# Patient Record
Sex: Male | Born: 1935 | Race: White | Hispanic: No | Marital: Married | Smoking: Former smoker
Health system: Southern US, Community
[De-identification: ages and names within clinical notes are randomized; demographics above are authoritative.]

## PROBLEM LIST (undated history)

## (undated) DIAGNOSIS — C61 Malignant neoplasm of prostate: Secondary | ICD-10-CM

## (undated) HISTORY — DX: Malignant neoplasm of prostate: C61

---

## 2015-02-16 DIAGNOSIS — C7951 Secondary malignant neoplasm of bone: Principal | ICD-10-CM

## 2015-02-16 DIAGNOSIS — C61 Malignant neoplasm of prostate: Secondary | ICD-10-CM | POA: Insufficient documentation

## 2017-10-08 ENCOUNTER — Other Ambulatory Visit (HOSPITAL_COMMUNITY): Payer: Self-pay | Admitting: *Deleted

## 2017-10-08 DIAGNOSIS — C7951 Secondary malignant neoplasm of bone: Principal | ICD-10-CM

## 2017-10-08 DIAGNOSIS — C61 Malignant neoplasm of prostate: Secondary | ICD-10-CM

## 2017-11-10 ENCOUNTER — Inpatient Hospital Stay (HOSPITAL_COMMUNITY): Payer: Medicare Other | Attending: Hematology

## 2017-11-10 ENCOUNTER — Encounter (HOSPITAL_COMMUNITY): Payer: Self-pay | Admitting: Hematology

## 2017-11-10 ENCOUNTER — Other Ambulatory Visit: Payer: Self-pay

## 2017-11-10 ENCOUNTER — Inpatient Hospital Stay (HOSPITAL_BASED_OUTPATIENT_CLINIC_OR_DEPARTMENT_OTHER): Payer: Medicare Other | Admitting: Hematology

## 2017-11-10 DIAGNOSIS — Z87891 Personal history of nicotine dependence: Secondary | ICD-10-CM | POA: Diagnosis not present

## 2017-11-10 DIAGNOSIS — Z191 Hormone sensitive malignancy status: Secondary | ICD-10-CM | POA: Diagnosis not present

## 2017-11-10 DIAGNOSIS — Z7982 Long term (current) use of aspirin: Secondary | ICD-10-CM | POA: Diagnosis not present

## 2017-11-10 DIAGNOSIS — Z79899 Other long term (current) drug therapy: Secondary | ICD-10-CM | POA: Insufficient documentation

## 2017-11-10 DIAGNOSIS — C61 Malignant neoplasm of prostate: Secondary | ICD-10-CM | POA: Insufficient documentation

## 2017-11-10 DIAGNOSIS — R232 Flushing: Secondary | ICD-10-CM | POA: Diagnosis not present

## 2017-11-10 DIAGNOSIS — C7951 Secondary malignant neoplasm of bone: Secondary | ICD-10-CM | POA: Insufficient documentation

## 2017-11-10 LAB — COMPREHENSIVE METABOLIC PANEL
ALBUMIN: 4.2 g/dL (ref 3.5–5.0)
ALT: 14 U/L — ABNORMAL LOW (ref 17–63)
ANION GAP: 14 (ref 5–15)
AST: 19 U/L (ref 15–41)
Alkaline Phosphatase: 124 U/L (ref 38–126)
BUN: 19 mg/dL (ref 6–20)
CHLORIDE: 103 mmol/L (ref 101–111)
CO2: 21 mmol/L — AB (ref 22–32)
Calcium: 9.5 mg/dL (ref 8.9–10.3)
Creatinine, Ser: 0.96 mg/dL (ref 0.61–1.24)
GFR calc Af Amer: >60 mL/min (ref >60)
GFR calc non Af Amer: >60 mL/min (ref >60)
Glucose, Bld: 94 mg/dL (ref 65–99)
POTASSIUM: 4 mmol/L (ref 3.5–5.1)
SODIUM: 138 mmol/L (ref 135–145)
Total Bilirubin: 1 mg/dL (ref 0.3–1.2)
Total Protein: 6.9 g/dL (ref 6.5–8.1)

## 2017-11-10 LAB — CBC WITH DIFFERENTIAL/PLATELET
BASOS PCT: 0 %
Basophils Absolute: 0 10*3/uL (ref 0.0–0.1)
EOS ABS: 0.3 10*3/uL (ref 0.0–0.7)
EOS PCT: 3 %
HCT: 37.3 % — ABNORMAL LOW (ref 39.0–52.0)
Hemoglobin: 12.4 g/dL — ABNORMAL LOW (ref 13.0–17.0)
LYMPHS ABS: 1 10*3/uL (ref 0.7–4.0)
Lymphocytes Relative: 11 %
MCH: 29.2 pg (ref 26.0–34.0)
MCHC: 33.2 g/dL (ref 30.0–36.0)
MCV: 88 fL (ref 78.0–100.0)
MONOS PCT: 8 %
Monocytes Absolute: 0.8 10*3/uL (ref 0.1–1.0)
Neutro Abs: 7.4 10*3/uL (ref 1.7–7.7)
Neutrophils Relative %: 78 %
PLATELETS: 186 10*3/uL (ref 150–400)
RBC: 4.24 MIL/uL (ref 4.22–5.81)
RDW: 15 % (ref 11.5–15.5)
WBC: 9.4 10*3/uL (ref 4.0–10.5)

## 2017-11-10 LAB — PSA: Prostatic Specific Antigen: 16.14 ng/mL — ABNORMAL HIGH (ref 0.00–4.00)

## 2017-11-10 NOTE — Progress Notes (Signed)
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  HISTORY OF PRESENTING ILLNESS:  Ryan Walls 84 y.o. male is seen today for follow-up of metastatic castration sensitive prostate cancer.  He has been taking Abiraterone and prednisone for over a year.  He is tolerating it very well.  He has occasional hot flashes.  He denies any new onset pains.  He recently had a knee replacement surgery and has done fairly well.  He denies any ankle swellings.  Denies any severe tiredness.  His last PSA is in the range of 16 done in [place] in February of this year.  He denies any hospitalizations other than for knee replacement surgery.  He denies any infections in the past 6 months.  No fevers, night sweats or weight loss reported.  MEDICAL HISTORY:  Past Medical History:  Diagnosis Date  . Prostate cancer [facility])     SURGICAL HISTORY: History reviewed. No pertinent surgical history.  SOCIAL HISTORY: Social History   Socioeconomic History  . Marital status: Married    Spouse name: Not on file  . Number of children: Not on file  . Years of education: Not on file  . Highest education level: Not on file  Occupational History  . Not on file  Social Needs  . Financial resource strain: Not on file  . Food insecurity:    Worry: Not on file    Inability: Not on file  . Transportation needs:    Medical: Not on file    Non-medical: Not on file  Tobacco Use  . Smoking status: Former Smoker    Types: Cigarettes    Last attempt to quit: 11/10/1977    Years since quitting: 40.0  Substance and Sexual Activity  . Alcohol use: Not on file  . Drug use: Not on file  . Sexual activity: Not on file  Lifestyle  . Physical activity:    Days per week: Not on file    Minutes per session: Not on file  . Stress: Not on file  Relationships  .  Social connections:    Talks on phone: Not on file    Gets together: Not on file    Attends religious service: Not on file    Active member of club or organization: Not on file    Attends meetings of clubs or organizations: Not on file    Relationship status: Not on file  . Intimate partner violence:    Fear of current or ex partner: Not on file    Emotionally abused: Not on file    Physically abused: Not on file    Forced sexual activity: Not on file  Other Topics Concern  . Not on file  Social History Narrative  . Not on file    FAMILY HISTORY: History reviewed. No pertinent family history.  ALLERGIES:  has no allergies on file.  MEDICATIONS:  Current Outpatient Medications  Medication Sig Dispense Refill  . aspirin 325 MG tablet Take by mouth.    . LUTEIN PO Take by mouth.    . predniSONE (DELTASONE) 5 MG tablet Take 10 mg by mouth daily.  99  . TRAVATAN Z 0.004 % SOLN ophthalmic solution     . Travoprost, BAK Free, (TRAVATAN Z) 0.004 % SOLN ophthalmic solution Apply to eye.    . Vitamin D, Ergocalciferol, (DRISDOL) 50000 units CAPS capsule Take  by mouth.    [name] ZYTIGA 500 MG tablet      No current facility-administered medications for this visit.     REVIEW OF SYSTEMS:   Constitutional: Denies fevers, chills or abnormal night sweats Eyes: Denies blurriness of vision, double vision or watery eyes Ears, nose, mouth, throat, and face: Denies mucositis or sore throat Respiratory: Denies cough, dyspnea or wheezes Cardiovascular: Denies palpitation, chest discomfort or lower extremity swelling Gastrointestinal:  Denies nausea, heartburn or change in bowel habits Skin: Denies abnormal skin rashes Lymphatics: Denies new lymphadenopathy or easy bruising Neurological:Denies numbness, tingling or new weaknesses Behavioral/Psych: Mood is stable, no new changes  All other systems were reviewed with the patient and are negative.  PHYSICAL EXAMINATION: ECOG PERFORMANCE STATUS: 1  - Symptomatic but completely ambulatory  Vitals:   11/10/17 1310  BP: (!) 123/99  Pulse: 91  Resp: 18  Temp: 98.6 F (37 C)  SpO2: 99%   There were no vitals filed for this visit.  GENERAL:alert, no distress and comfortable SKIN: skin color, texture, turgor are normal, no rashes or significant lesions EYES: normal, conjunctiva are pink and non-injected, sclera clear OROPHARYNX:no exudate, no erythema and lips, buccal mucosa, and tongue normal  NECK: supple, thyroid normal size, non-tender, without nodularity LYMPH:  no palpable lymphadenopathy in the cervical, axillary or inguinal LUNGS: clear to auscultation and percussion with normal breathing effort HEART: regular rate & rhythm and no murmurs and no lower extremity edema PSYCH: alert & oriented x 3 with fluent speech NEURO: no focal motor/sensory deficits  LABORATORY DATA:  I have reviewed the data as listed Lab Results  Component Value Date   WBC 9.4 11/10/2017   HGB 12.4 (L) 11/10/2017   HCT 37.3 (L) 11/10/2017   MCV 88.0 11/10/2017   PLT 186 11/10/2017     Chemistry      Component Value Date/Time   NA 138 11/10/2017 1218   K 4.0 11/10/2017 1218   CL 103 11/10/2017 1218   CO2 21 (L) 11/10/2017 1218   BUN 19 11/10/2017 1218   CREATININE 0.96 11/10/2017 1218      Component Value Date/Time   CALCIUM 9.5 11/10/2017 1218   ALKPHOS 124 11/10/2017 1218   AST 19 11/10/2017 1218   ALT 14 (L) 11/10/2017 1218   BILITOT 1.0 11/10/2017 1218       RADIOGRAPHIC STUDIES: I have reviewed results from bone scan from July 2016.  ASSESSMENT & PLAN:  Prostate cancer metastatic to bone ([place]) 1.  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 -He is tolerating it very well.  His liver function panel was within normal limits.  His PSA from today is pending.  Last PSA of around 16 done in [place] in February 2019, last bone scan in July 2016 showing activity in the midshaft of the left humerus, left calvarium, posterior right seventh rib, eighth and ninth ribs on the right side. -He receives Lupron every 6 months at Dr. Claris Che office.  2.  Bone metastasis: We talked about initiating him on denosumab.  He was reluctant to consider it.  We will revisit this issue at next visit.  Derek Jack, MD 11/10/2017 2:10 PM

## 2017-11-10 NOTE — Assessment & Plan Note (Addendum)
1.  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 -He is tolerating it very well.  His liver function panel was within normal limits.  His PSA from today is pending.  Last PSA of around 16 done in [place] in February 2019, last bone scan in July 2016 showing activity in the midshaft of the left humerus, left calvarium, posterior right seventh rib, eighth and ninth ribs on the right side. -He receives Lupron every 6 months at Dr. Claris Che office.  2.  Bone metastasis: We talked about initiating him on denosumab.  He was reluctant to consider it.  We will revisit this issue at next visit.

## 2017-11-13 ENCOUNTER — Ambulatory Visit (HOSPITAL_COMMUNITY): Payer: Self-pay | Admitting: Hematology

## 2017-11-13 ENCOUNTER — Other Ambulatory Visit (HOSPITAL_COMMUNITY): Payer: Self-pay

## 2017-11-16 ENCOUNTER — Other Ambulatory Visit (HOSPITAL_COMMUNITY): Payer: Self-pay | Admitting: Hematology

## 2017-11-30 ENCOUNTER — Other Ambulatory Visit (HOSPITAL_COMMUNITY): Payer: Self-pay

## 2017-11-30 MED ORDER — ZYTIGA 500 MG PO TABS: 1000.0000 mg | ORAL_TABLET | Freq: Every day | ORAL | 0 refills | Status: DC

## 2017-11-30 MED ORDER — ZYTIGA 500 MG PO TABS: 500.0000 mg | ORAL_TABLET | Freq: Every day | ORAL | 0 refills | Status: DC

## 2017-12-01 ENCOUNTER — Other Ambulatory Visit (HOSPITAL_COMMUNITY): Payer: Self-pay

## 2017-12-01 DIAGNOSIS — C61 Malignant neoplasm of prostate: Secondary | ICD-10-CM

## 2017-12-01 DIAGNOSIS — C7951 Secondary malignant neoplasm of bone: Principal | ICD-10-CM

## 2017-12-01 MED ORDER — ZYTIGA 500 MG PO TABS: 1000.0000 mg | ORAL_TABLET | Freq: Every day | ORAL | 0 refills | Status: DC

## 2018-01-17 ENCOUNTER — Other Ambulatory Visit (HOSPITAL_COMMUNITY): Payer: Self-pay | Admitting: Hematology

## 2018-01-17 DIAGNOSIS — C61 Malignant neoplasm of prostate: Secondary | ICD-10-CM

## 2018-01-17 DIAGNOSIS — C7951 Secondary malignant neoplasm of bone: Principal | ICD-10-CM

## 2018-01-21 ENCOUNTER — Telehealth (HOSPITAL_COMMUNITY): Payer: Self-pay | Admitting: *Deleted

## 2018-01-22 ENCOUNTER — Other Ambulatory Visit (HOSPITAL_COMMUNITY): Payer: Self-pay

## 2018-01-22 DIAGNOSIS — C61 Malignant neoplasm of prostate: Secondary | ICD-10-CM

## 2018-01-22 DIAGNOSIS — C7951 Secondary malignant neoplasm of bone: Principal | ICD-10-CM

## 2018-01-22 MED ORDER — ZYTIGA 500 MG PO TABS: 1000.0000 mg | ORAL_TABLET | Freq: Every day | ORAL | 0 refills | Status: DC

## 2018-01-22 NOTE — Telephone Encounter (Signed)
Received refill request from patient Zytiga. Reviewed with provider, chart checked and refilled.

## 2018-02-09 ENCOUNTER — Other Ambulatory Visit (HOSPITAL_COMMUNITY): Payer: Self-pay

## 2018-02-09 DIAGNOSIS — C61 Malignant neoplasm of prostate: Secondary | ICD-10-CM

## 2018-02-09 DIAGNOSIS — C7951 Secondary malignant neoplasm of bone: Principal | ICD-10-CM

## 2018-02-10 ENCOUNTER — Inpatient Hospital Stay (HOSPITAL_COMMUNITY): Payer: Medicare Other | Attending: Hematology

## 2018-02-10 ENCOUNTER — Inpatient Hospital Stay (HOSPITAL_COMMUNITY): Payer: Medicare Other | Attending: Hematology | Admitting: Hematology

## 2018-02-10 ENCOUNTER — Encounter (HOSPITAL_COMMUNITY): Payer: Self-pay | Admitting: Hematology

## 2018-02-10 ENCOUNTER — Other Ambulatory Visit: Payer: Self-pay

## 2018-02-10 VITALS — BP 140/76 | HR 91 | Temp 98.3°F | Resp 18 | Wt 246.4 lb

## 2018-02-10 DIAGNOSIS — C61 Malignant neoplasm of prostate: Secondary | ICD-10-CM | POA: Insufficient documentation

## 2018-02-10 DIAGNOSIS — Z7982 Long term (current) use of aspirin: Secondary | ICD-10-CM

## 2018-02-10 DIAGNOSIS — Z191 Hormone sensitive malignancy status: Secondary | ICD-10-CM | POA: Diagnosis not present

## 2018-02-10 DIAGNOSIS — Z87891 Personal history of nicotine dependence: Secondary | ICD-10-CM | POA: Diagnosis not present

## 2018-02-10 DIAGNOSIS — R232 Flushing: Secondary | ICD-10-CM | POA: Insufficient documentation

## 2018-02-10 DIAGNOSIS — Z79899 Other long term (current) drug therapy: Secondary | ICD-10-CM | POA: Diagnosis not present

## 2018-02-10 DIAGNOSIS — C7951 Secondary malignant neoplasm of bone: Secondary | ICD-10-CM | POA: Diagnosis not present

## 2018-02-10 LAB — COMPREHENSIVE METABOLIC PANEL
ALK PHOS: 108 U/L (ref 38–126)
ALT: 15 U/L (ref 0–44)
AST: 19 U/L (ref 15–41)
Albumin: 4 g/dL (ref 3.5–5.0)
Anion gap: 6 (ref 5–15)
BILIRUBIN TOTAL: 1.1 mg/dL (ref 0.3–1.2)
BUN: 17 mg/dL (ref 8–23)
CHLORIDE: 106 mmol/L (ref 98–111)
CO2: 27 mmol/L (ref 22–32)
Calcium: 8.9 mg/dL (ref 8.9–10.3)
Creatinine, Ser: 1.21 mg/dL (ref 0.61–1.24)
GFR calc Af Amer: >60 mL/min (ref >60)
GFR, EST NON AFRICAN AMERICAN: 54 mL/min — AB (ref >60)
Glucose, Bld: 105 mg/dL — ABNORMAL HIGH (ref 70–99)
Potassium: 4 mmol/L (ref 3.5–5.1)
Sodium: 139 mmol/L (ref 135–145)
TOTAL PROTEIN: 6.4 g/dL — AB (ref 6.5–8.1)

## 2018-02-10 LAB — CBC WITH DIFFERENTIAL/PLATELET
BASOS PCT: 0 %
Basophils Absolute: 0 10*3/uL (ref 0.0–0.1)
EOS ABS: 0.3 10*3/uL (ref 0.0–0.7)
EOS PCT: 4 %
HCT: 38.3 % — ABNORMAL LOW (ref 39.0–52.0)
HEMOGLOBIN: 12.8 g/dL — AB (ref 13.0–17.0)
LYMPHS ABS: 1.2 10*3/uL (ref 0.7–4.0)
Lymphocytes Relative: 14 %
MCH: 29.8 pg (ref 26.0–34.0)
MCHC: 33.4 g/dL (ref 30.0–36.0)
MCV: 89.3 fL (ref 78.0–100.0)
MONOS PCT: 10 %
Monocytes Absolute: 0.9 10*3/uL (ref 0.1–1.0)
Neutro Abs: 6.4 10*3/uL (ref 1.7–7.7)
Neutrophils Relative %: 72 %
PLATELETS: 183 10*3/uL (ref 150–400)
RBC: 4.29 MIL/uL (ref 4.22–5.81)
RDW: 15 % (ref 11.5–15.5)
WBC: 8.8 10*3/uL (ref 4.0–10.5)

## 2018-02-10 LAB — PSA: PROSTATIC SPECIFIC ANTIGEN: 12.04 ng/mL — AB (ref 0.00–4.00)

## 2018-02-10 NOTE — Assessment & Plan Note (Addendum)
1.  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 - He receives Lupron injection every 6 months at Dr. Claris Che office. - He is tolerating Zytiga thousand milligrams very well.  He takes prednisone 10 mg daily with it.  Last PSA 3 months ago was stable at 16.1.  PSA from today's pending.  We will do scans if there is any significant change in his PSA levels.  Last bone scan was done in July 2016 showing activity in the midshaft of the left humerus, left calvarium, posterior right seventh rib, eighth and ninth ribs on the right side.  2.  Bone metastasis: We talked about initiating him on denosumab.  He was reluctant to consider it.  We will revisit this at another time.

## 2018-02-10 NOTE — Progress Notes (Signed)
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  CURRENT THERAPY: Zytiga along with prednisone.   INTERVAL HISTORY:  Ryan Walls 84 y.o. male returns for follow-up of his prostate cancer to the bones.  He is tolerating Zytiga along with prednisone very well.  Minor hot flashes present.  Denies any new onset pains.  Denies any problems with urination.  No fevers or infections noted.  He is ambulating well after his knee replacement surgery.  No infections or hospitalizations were reported.    REVIEW OF SYSTEMS:  Review of Systems  Constitutional:       Minor hot flashes.  All other systems reviewed and are negative.    PAST MEDICAL/SURGICAL HISTORY:  Past Medical History:  Diagnosis Date  . Prostate cancer [facility])    History reviewed. No pertinent surgical history.   SOCIAL HISTORY:  Social History   Socioeconomic History  . Marital status: Married    Spouse name: Not on file  . Number of children: Not on file  . Years of education: Not on file  . Highest education level: Not on file  Occupational History  . Not on file  Social Needs  . Financial resource strain: Not on file  . Food insecurity:    Worry: Not on file    Inability: Not on file  . Transportation needs:    Medical: Not on file    Non-medical: Not on file  Tobacco Use  . Smoking status: Former Smoker    Types: Cigarettes    Last attempt to quit: 11/10/1977    Years since quitting: 40.2  Substance and Sexual Activity  . Alcohol use: Not on file  . Drug use: Not on file  . Sexual activity: Not on file  Lifestyle  . Physical activity:    Days per week: Not on file    Minutes per session: Not on file  . Stress: Not on file  Relationships  . Social  connections:    Talks on phone: Not on file    Gets together: Not on file    Attends religious service: Not on file    Active member of club or organization: Not on file    Attends meetings of clubs or organizations: Not on file    Relationship status: Not on file  . Intimate partner violence:    Fear of current or ex partner: Not on file    Emotionally abused: Not on file    Physically abused: Not on file    Forced sexual activity: Not on file  Other Topics Concern  . Not on file  Social History Narrative  . Not on file    FAMILY HISTORY:  History reviewed. No pertinent family history.  CURRENT MEDICATIONS:  Outpatient Encounter Medications as of 02/10/2018  Medication Sig  . aspirin 325 MG tablet Take by mouth.  . LUTEIN PO Take by mouth.  . predniSONE (DELTASONE) 5 MG tablet Take 10 mg by mouth daily.  . Vitamin D, Ergocalciferol, (DRISDOL) 50000 units CAPS capsule Take by mouth.  [name] ZYTIGA 500 MG tablet Take 2 tablets (1,000 mg total) by mouth daily.  . [DISCONTINUED] TRAVATAN Z 0.004 % SOLN ophthalmic solution   . [DISCONTINUED] Travoprost, BAK Free, (TRAVATAN Z) 0.004 % SOLN ophthalmic solution Apply  to eye.   No facility-administered encounter medications on file as of 02/10/2018.     ALLERGIES:  Not on File   PHYSICAL EXAM:  ECOG Performance status: 1  Vitals:   02/10/18 1510  BP: 140/76  Pulse: 91  Resp: 18  Temp: 98.3 F (36.8 C)  SpO2: 98%   Filed Weights   02/10/18 1510  Weight: 246 lb 6.4 oz (111.8 kg)    Physical Exam   LABORATORY DATA:  I have reviewed the labs as listed.  CBC    Component Value Date/Time   WBC 8.8 02/10/2018 1415   RBC 4.29 02/10/2018 1415   HGB 12.8 (L) 02/10/2018 1415   HCT 38.3 (L) 02/10/2018 1415   PLT 183 02/10/2018 1415   MCV 89.3 02/10/2018 1415   MCH 29.8 02/10/2018 1415   MCHC 33.4 02/10/2018 1415   RDW 15.0 02/10/2018 1415   LYMPHSABS 1.2 02/10/2018 1415   MONOABS 0.9 02/10/2018 1415   EOSABS 0.3  02/10/2018 1415   BASOSABS 0.0 02/10/2018 1415   CMP Latest Ref Rng & Units 02/10/2018 11/10/2017  Glucose 70 - 99 mg/dL 105(H) 94  BUN 8 - 23 mg/dL 17 19  Creatinine 0.61 - 1.24 mg/dL 1.21 0.96  Sodium 135 - 145 mmol/L 139 138  Potassium 3.5 - 5.1 mmol/L 4.0 4.0  Chloride 98 - 111 mmol/L 106 103  CO2 22 - 32 mmol/L 27 21(L)  Calcium 8.9 - 10.3 mg/dL 8.9 9.5  Total Protein 6.5 - 8.1 g/dL 6.4(L) 6.9  Total Bilirubin 0.3 - 1.2 mg/dL 1.1 1.0  Alkaline Phos 38 - 126 U/L 108 124  AST 15 - 41 U/L 19 19  ALT 0 - 44 U/L 15 14(L)      ASSESSMENT & PLAN:   Prostate cancer metastatic to bone ([facility]) 1.  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 - He receives Lupron injection every 6 months at Dr. Claris Che office. - He is tolerating Zytiga thousand milligrams very well.  He takes prednisone 10 mg daily with it.  Last PSA 3 months ago was stable at 16.1.  PSA from today's pending.  We will do scans if there is any significant change in his PSA levels.  Last bone scan was done in July 2016 showing activity in the midshaft of the left humerus, left calvarium, posterior right seventh rib, eighth and ninth ribs on the right side.  2.  Bone metastasis: We talked about initiating him on denosumab.  He was reluctant to consider it.  We will revisit this at another time.       Orders placed this encounter:  Orders Placed This Encounter  Procedures  . CBC with Differential/Platelet  . Comprehensive metabolic panel  . PSA      Derek Jack, MD [place] [phone]

## 2018-02-10 NOTE — Progress Notes (Deleted)
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     CANCER STAGING: Cancer Staging No matching staging information was found for the patient.   INTERVAL HISTORY:  Ryan Walls 84 y.o. 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   Overall, he tells me he has been feeling pretty well. Energy levels ***; appetite ***.      REVIEW OF SYSTEMS:  Review of Systems - Oncology   PAST MEDICAL/SURGICAL HISTORY:  Past Medical History:  Diagnosis Date  . Prostate cancer ([place])    No past surgical history on file.   SOCIAL HISTORY:  Social History   Socioeconomic History  . Marital status: Married    Spouse name: Not on file  . Number of children: Not on file  . Years of education: Not on file  . Highest education level: Not on file  Occupational History  . Not on file  Social Needs  . Financial resource strain: Not on file  . Food insecurity:    Worry: Not on file    Inability: Not on file  . Transportation needs:    Medical: Not on file    Non-medical: Not on file  Tobacco Use  . Smoking status: Former Smoker    Types: Cigarettes    Last attempt to quit: 11/10/1977    Years since quitting: 40.2  Substance and Sexual Activity  . Alcohol use: Not on file  . Drug use: Not on file  . Sexual activity: Not on file  Lifestyle  . Physical activity:    Days per week: Not on file    Minutes per session: Not on file  . Stress: Not on file  Relationships  . Social connections:    Talks on phone: Not on file    Gets together: Not on file    Attends religious service: Not on file    Active member of club or organization: Not on file    Attends  meetings of clubs or organizations: Not on file    Relationship status: Not on file  . Intimate partner violence:    Fear of current or ex partner: Not on file    Emotionally abused: Not on file    Physically abused: Not on file    Forced sexual activity: Not on file  Other Topics Concern  . Not on file  Social History Narrative  . Not on file    FAMILY HISTORY:  No family history on file.  CURRENT MEDICATIONS:  Outpatient Encounter Medications as of 02/10/2018  Medication Sig  . aspirin 325 MG tablet Take by mouth.  . LUTEIN PO Take by mouth.  . predniSONE (DELTASONE) 5 MG tablet Take 10 mg by mouth daily.  . TRAVATAN Z 0.004 % SOLN ophthalmic solution   . Travoprost, BAK Free, (TRAVATAN Z) 0.004 % SOLN ophthalmic solution Apply to eye.  . Vitamin D, Ergocalciferol, (DRISDOL) 50000 units CAPS capsule Take by mouth.  [name] ZYTIGA 500 MG tablet Take 2 tablets (1,000 mg total) by mouth daily.   No facility-administered encounter medications on file as of 02/10/2018.     ALLERGIES:  Not on File   PHYSICAL EXAM:  ECOG Performance status:  1  There were no vitals filed for this visit. There were no vitals filed for this visit.  Physical Exam   LABORATORY DATA:  I have reviewed the labs as listed.  CBC    Component Value Date/Time   WBC 8.8 02/10/2018 1415   RBC 4.29 02/10/2018 1415   HGB 12.8 (L) 02/10/2018 1415   HCT 38.3 (L) 02/10/2018 1415   PLT 183 02/10/2018 1415   MCV 89.3 02/10/2018 1415   MCH 29.8 02/10/2018 1415   MCHC 33.4 02/10/2018 1415   RDW 15.0 02/10/2018 1415   LYMPHSABS 1.2 02/10/2018 1415   MONOABS 0.9 02/10/2018 1415   EOSABS 0.3 02/10/2018 1415   BASOSABS 0.0 02/10/2018 1415   CMP Latest Ref Rng & Units 11/10/2017  Glucose 65 - 99 mg/dL 94  BUN 6 - 20 mg/dL 19  Creatinine 0.61 - 1.24 mg/dL 0.96  Sodium 135 - 145 mmol/L 138  Potassium 3.5 - 5.1 mmol/L 4.0  Chloride 101 - 111 mmol/L 103  CO2 22 - 32 mmol/L 21(L)  Calcium 8.9 - 10.3 mg/dL 9.5    Total Protein 6.5 - 8.1 g/dL 6.9  Total Bilirubin 0.3 - 1.2 mg/dL 1.0  Alkaline Phos 38 - 126 U/L 124  AST 15 - 41 U/L 19  ALT 17 - 63 U/L 14(L)       DIAGNOSTIC IMAGING:  *The following radiologic images and reports have been reviewed independently and agree with below findings.      ASSESSMENT & PLAN:   No problem-specific Assessment & Plan notes found for this encounter.      Orders placed this encounter:  No orders of the defined types were placed in this encounter.     Derek Jack, MD [place] [phone]

## 2018-02-22 ENCOUNTER — Other Ambulatory Visit (HOSPITAL_COMMUNITY): Payer: Self-pay | Admitting: *Deleted

## 2018-02-22 DIAGNOSIS — C7951 Secondary malignant neoplasm of bone: Principal | ICD-10-CM

## 2018-02-22 DIAGNOSIS — C61 Malignant neoplasm of prostate: Secondary | ICD-10-CM

## 2018-02-22 MED ORDER — ZYTIGA 500 MG PO TABS: 1000.0000 mg | ORAL_TABLET | Freq: Every day | ORAL | 0 refills | Status: DC

## 2018-02-22 NOTE — Telephone Encounter (Signed)
Chart reviewed, per Dr. Almeta Monas last office note, zytiga refilled.

## 2018-03-20 ENCOUNTER — Other Ambulatory Visit (HOSPITAL_COMMUNITY): Payer: Self-pay | Admitting: Hematology

## 2018-03-20 DIAGNOSIS — C61 Malignant neoplasm of prostate: Secondary | ICD-10-CM

## 2018-03-20 DIAGNOSIS — C7951 Secondary malignant neoplasm of bone: Principal | ICD-10-CM

## 2018-03-26 ENCOUNTER — Other Ambulatory Visit (HOSPITAL_COMMUNITY): Payer: Self-pay | Admitting: *Deleted

## 2018-03-26 DIAGNOSIS — C61 Malignant neoplasm of prostate: Secondary | ICD-10-CM

## 2018-03-26 DIAGNOSIS — C7951 Secondary malignant neoplasm of bone: Principal | ICD-10-CM

## 2018-03-26 MED ORDER — ZYTIGA 500 MG PO TABS: 1000.0000 mg | ORAL_TABLET | Freq: Every day | ORAL | 0 refills | Status: DC

## 2018-04-19 ENCOUNTER — Other Ambulatory Visit (HOSPITAL_COMMUNITY): Payer: Self-pay | Admitting: Hematology

## 2018-04-19 DIAGNOSIS — C7951 Secondary malignant neoplasm of bone: Principal | ICD-10-CM

## 2018-04-19 DIAGNOSIS — C61 Malignant neoplasm of prostate: Secondary | ICD-10-CM

## 2018-04-22 ENCOUNTER — Other Ambulatory Visit (HOSPITAL_COMMUNITY): Payer: Self-pay | Admitting: Nurse Practitioner

## 2018-05-03 ENCOUNTER — Other Ambulatory Visit (HOSPITAL_COMMUNITY): Payer: Self-pay | Admitting: *Deleted

## 2018-05-04 ENCOUNTER — Telehealth (HOSPITAL_COMMUNITY): Payer: Self-pay | Admitting: Hematology

## 2018-05-04 ENCOUNTER — Other Ambulatory Visit (HOSPITAL_COMMUNITY): Payer: Self-pay | Admitting: *Deleted

## 2018-05-04 DIAGNOSIS — C7951 Secondary malignant neoplasm of bone: Principal | ICD-10-CM

## 2018-05-04 DIAGNOSIS — C61 Malignant neoplasm of prostate: Secondary | ICD-10-CM

## 2018-05-04 MED ORDER — ZYTIGA 500 MG PO TABS: 1000.0000 mg | ORAL_TABLET | Freq: Every day | ORAL | 0 refills | Status: DC

## 2018-05-04 NOTE — Telephone Encounter (Signed)
Per Dr. Delton Coombes patient is to stay on 1000mg  daily of Zytiga.  Fabio Asa is refilled.

## 2018-05-04 NOTE — Telephone Encounter (Signed)
FAXED [place]

## 2018-05-05 ENCOUNTER — Other Ambulatory Visit (HOSPITAL_COMMUNITY): Payer: Self-pay | Admitting: Hematology

## 2018-05-05 DIAGNOSIS — C7951 Secondary malignant neoplasm of bone: Principal | ICD-10-CM

## 2018-05-05 DIAGNOSIS — C61 Malignant neoplasm of prostate: Secondary | ICD-10-CM

## 2018-05-17 ENCOUNTER — Other Ambulatory Visit: Payer: Self-pay

## 2018-05-17 ENCOUNTER — Inpatient Hospital Stay (HOSPITAL_COMMUNITY): Payer: Medicare Other | Attending: Hematology

## 2018-05-17 ENCOUNTER — Encounter (HOSPITAL_COMMUNITY): Payer: Self-pay | Admitting: Hematology

## 2018-05-17 ENCOUNTER — Inpatient Hospital Stay (HOSPITAL_BASED_OUTPATIENT_CLINIC_OR_DEPARTMENT_OTHER): Payer: Medicare Other | Admitting: Hematology

## 2018-05-17 VITALS — BP 123/58 | HR 78 | Temp 97.8°F | Resp 18 | Wt 249.0 lb

## 2018-05-17 DIAGNOSIS — C7951 Secondary malignant neoplasm of bone: Secondary | ICD-10-CM

## 2018-05-17 DIAGNOSIS — Z7982 Long term (current) use of aspirin: Secondary | ICD-10-CM | POA: Insufficient documentation

## 2018-05-17 DIAGNOSIS — Z87891 Personal history of nicotine dependence: Secondary | ICD-10-CM | POA: Insufficient documentation

## 2018-05-17 DIAGNOSIS — I4891 Unspecified atrial fibrillation: Secondary | ICD-10-CM | POA: Diagnosis not present

## 2018-05-17 DIAGNOSIS — Z7901 Long term (current) use of anticoagulants: Secondary | ICD-10-CM

## 2018-05-17 DIAGNOSIS — Z79899 Other long term (current) drug therapy: Secondary | ICD-10-CM | POA: Diagnosis not present

## 2018-05-17 DIAGNOSIS — Z191 Hormone sensitive malignancy status: Secondary | ICD-10-CM

## 2018-05-17 DIAGNOSIS — C61 Malignant neoplasm of prostate: Secondary | ICD-10-CM

## 2018-05-17 LAB — COMPREHENSIVE METABOLIC PANEL
ALBUMIN: 4.1 g/dL (ref 3.5–5.0)
ALT: 16 U/L (ref 0–44)
ANION GAP: 6 (ref 5–15)
AST: 18 U/L (ref 15–41)
Alkaline Phosphatase: 84 U/L (ref 38–126)
BILIRUBIN TOTAL: 1.1 mg/dL (ref 0.3–1.2)
BUN: 19 mg/dL (ref 8–23)
CHLORIDE: 109 mmol/L (ref 98–111)
CO2: 25 mmol/L (ref 22–32)
Calcium: 8.9 mg/dL (ref 8.9–10.3)
Creatinine, Ser: 1.26 mg/dL — ABNORMAL HIGH (ref 0.61–1.24)
GFR calc Af Amer: 60 mL/min — ABNORMAL LOW (ref >60)
GFR calc non Af Amer: 51 mL/min — ABNORMAL LOW (ref >60)
Glucose, Bld: 120 mg/dL — ABNORMAL HIGH (ref 70–99)
POTASSIUM: 3.9 mmol/L (ref 3.5–5.1)
SODIUM: 140 mmol/L (ref 135–145)
TOTAL PROTEIN: 6.3 g/dL — AB (ref 6.5–8.1)

## 2018-05-17 LAB — CBC WITH DIFFERENTIAL/PLATELET
Abs Immature Granulocytes: 0.04 10*3/uL (ref 0.00–0.07)
BASOS ABS: 0 10*3/uL (ref 0.0–0.1)
Basophils Relative: 1 %
EOS ABS: 0.3 10*3/uL (ref 0.0–0.5)
Eosinophils Relative: 4 %
HEMATOCRIT: 38 % — AB (ref 39.0–52.0)
Hemoglobin: 12.7 g/dL — ABNORMAL LOW (ref 13.0–17.0)
IMMATURE GRANULOCYTES: 1 %
LYMPHS ABS: 0.7 10*3/uL (ref 0.7–4.0)
LYMPHS PCT: 9 %
MCH: 30.6 pg (ref 26.0–34.0)
MCHC: 33.4 g/dL (ref 30.0–36.0)
MCV: 91.6 fL (ref 80.0–100.0)
Monocytes Absolute: 0.7 10*3/uL (ref 0.1–1.0)
Monocytes Relative: 8 %
NEUTROS PCT: 77 %
NRBC: 0 % (ref 0.0–0.2)
Neutro Abs: 6.4 10*3/uL (ref 1.7–7.7)
Platelets: 177 10*3/uL (ref 150–400)
RBC: 4.15 MIL/uL — AB (ref 4.22–5.81)
RDW: 14.8 % (ref 11.5–15.5)
WBC: 8.2 10*3/uL (ref 4.0–10.5)

## 2018-05-17 LAB — PSA: Prostatic Specific Antigen: 12.89 ng/mL — ABNORMAL HIGH (ref 0.00–4.00)

## 2018-05-17 NOTE — Patient Instructions (Signed)
[facility] Discharge Instructions  Follow up in 3 months with labs    Thank you for choosing [facility] to provide your oncology and hematology care.  To afford each patient quality time with our provider, please arrive at least 15 minutes before your scheduled appointment time.   If you have a lab appointment with the Cancer Center please come in thru the  Main Entrance and check in at the main information desk  You need to re-schedule your appointment should you arrive 10 or more minutes late.  We strive to give you quality time with our providers, and arriving late affects you and other patients whose appointments are after yours.  Also, if you no show three or more times for appointments you may be dismissed from the clinic at the providers discretion.     Again, thank you for choosing [facility].  Our hope is that these requests will decrease the amount of time that you wait before being seen by our physicians.       _____________________________________________________________  Should you have questions after your visit to [facility], please contact our office at [phone] between the hours of 8:00 a.m. and 4:30 p.m.  Voicemails left after 4:00 p.m. will not be returned until the following business day.  For prescription refill requests, have your pharmacy contact our office and allow 72 hours.    Cancer Center Support Programs:   > Cancer Support Group  2nd Tuesday of the month 1pm-2pm, Journey Room    

## 2018-05-17 NOTE — Assessment & Plan Note (Signed)
1.  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 - He is tolerating Zytiga very well along with prednisone.  Last scans done in July 2016 shows activity in the midshaft of the left humerus, left calvarium, posterior right seventh rib, eighth and ninth ribs on the right side. - His last PSA on 02/10/2018 was 12.04.  He reportedly had last Lupron injection 6 weeks ago at Dr. Claris Che office. - He was reportedly hospitalized for couple of days at [facility] for atrial fibrillation.  He is currently on amiodarone 200 mg daily along with Eliquis 5 mg twice daily. -We will follow-up on his PSA from today.  We will see him back in 3 months for follow-up.  2.  Bone metastasis: We talked about initiating him on denosumab.  He was reluctant to consider it.  We will revisit this at another time.

## 2018-05-17 NOTE — Progress Notes (Signed)
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  CURRENT THERAPY: Zytiga and prednisone   INTERVAL HISTORY:  Ryan Walls 84 y.o. 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 Patient is here with his wife today and doing well. He had one episode of A-fib where he was hospitalized. Otherwise he has no other complaints or side effects. His appetite and energy level are 75% and he has no problem maintaining his weight. He lives at home where he does all his own ADLs and activities. He denies any skin rashes or mouth sores. Denies any new pains. Denies any problems urinating or hematuria.     REVIEW OF SYSTEMS:  Review of Systems  All other systems reviewed and are negative.    PAST MEDICAL/SURGICAL HISTORY:  Past Medical History:  Diagnosis Date  . Prostate cancer [facility])    History reviewed. No pertinent surgical history.   SOCIAL HISTORY:  Social History   Socioeconomic History  . Marital status: Married    Spouse name: Not on file  . Number of children: Not on file  . Years of education: Not on file  . Highest education level: Not on file  Occupational History  . Not on file  Social Needs  . Financial resource strain: Not on file  . Food insecurity:    Worry: Not on file    Inability: Not on file  . Transportation needs:    Medical: Not on file    Non-medical: Not on file  Tobacco Use  . Smoking status: Former Smoker    Types: Cigarettes    Last attempt to quit: 11/10/1977    Years since quitting: 40.5  Substance and Sexual Activity  . Alcohol use: Not on file  . Drug use: Not on file  . Sexual activity: Not on file  Lifestyle  . Physical activity:    Days per week:  Not on file    Minutes per session: Not on file  . Stress: Not on file  Relationships  . Social connections:    Talks on phone: Not on file    Gets together: Not on file    Attends religious service: Not on file    Active member of club or organization: Not on file    Attends meetings of clubs or organizations: Not on file    Relationship status: Not on file  . Intimate partner violence:    Fear of current or ex partner: Not on file    Emotionally abused: Not on file    Physically abused: Not on file    Forced sexual activity: Not on file  Other Topics Concern  . Not on file  Social History Narrative  . Not on file    FAMILY HISTORY:  History reviewed. No pertinent family history.  CURRENT MEDICATIONS:  Outpatient Encounter Medications as of 05/17/2018  Medication Sig  . amiodarone (PACERONE) 200 MG tablet   . aspirin 325 MG tablet Take by mouth.  Arne Cleveland 5 MG TABS tablet Take 5 mg by mouth 2 (two) times daily.  . metoprolol succinate (TOPROL-XL) 25 MG 24 hr tablet Take 25 mg by mouth every morning.  . Multiple Vitamins-Minerals (MULTIVITAL PLATINUM) TABS Take by mouth.  . predniSONE (DELTASONE) 5 MG  tablet Take 10 mg by mouth daily.  . TRAVATAN Z 0.004 % SOLN ophthalmic solution   . Vitamin D, Ergocalciferol, (DRISDOL) 50000 units CAPS capsule Take by mouth.  [name] ZYTIGA 500 MG tablet Take 2 tablets (1,000 mg total) by mouth daily.  . [DISCONTINUED] LUTEIN PO Take by mouth.   No facility-administered encounter medications on file as of 05/17/2018.     ALLERGIES:  Not on File   PHYSICAL EXAM:  ECOG Performance status: 1  Vitals:   05/17/18 1432  BP: (!) 123/58  Pulse: 78  Resp: 18  Temp: 97.8 F (36.6 C)  SpO2: 99%   Filed Weights   05/17/18 1432  Weight: 249 lb (112.9 kg)    Physical Exam  Constitutional: He is oriented to person, place, and time. He appears well-developed and well-nourished.  Musculoskeletal: Normal range of motion.  Neurological: He  is alert and oriented to person, place, and time.  Skin: Skin is warm and dry.  Psychiatric: He has a normal mood and affect. His behavior is normal. Judgment and thought content normal.     LABORATORY DATA:  I have reviewed the labs as listed.  CBC    Component Value Date/Time   WBC 8.2 05/17/2018 1325   RBC 4.15 (L) 05/17/2018 1325   HGB 12.7 (L) 05/17/2018 1325   HCT 38.0 (L) 05/17/2018 1325   PLT 177 05/17/2018 1325   MCV 91.6 05/17/2018 1325   MCH 30.6 05/17/2018 1325   MCHC 33.4 05/17/2018 1325   RDW 14.8 05/17/2018 1325   LYMPHSABS 0.7 05/17/2018 1325   MONOABS 0.7 05/17/2018 1325   EOSABS 0.3 05/17/2018 1325   BASOSABS 0.0 05/17/2018 1325   CMP Latest Ref Rng & Units 05/17/2018 02/10/2018 11/10/2017  Glucose 70 - 99 mg/dL 120(H) 105(H) 94  BUN 8 - 23 mg/dL 19 17 19   Creatinine 0.61 - 1.24 mg/dL 1.26(H) 1.21 0.96  Sodium 135 - 145 mmol/L 140 139 138  Potassium 3.5 - 5.1 mmol/L 3.9 4.0 4.0  Chloride 98 - 111 mmol/L 109 106 103  CO2 22 - 32 mmol/L 25 27 21(L)  Calcium 8.9 - 10.3 mg/dL 8.9 8.9 9.5  Total Protein 6.5 - 8.1 g/dL 6.3(L) 6.4(L) 6.9  Total Bilirubin 0.3 - 1.2 mg/dL 1.1 1.1 1.0  Alkaline Phos 38 - 126 U/L 84 108 124  AST 15 - 41 U/L 18 19 19   ALT 0 - 44 U/L 16 15 14(L)        ASSESSMENT & PLAN:   Prostate cancer metastatic to bone (HCC) 1.  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 - He is tolerating Zytiga very well along with prednisone.  Last scans done in July 2016 shows activity in the midshaft of the left humerus, left calvarium, posterior right seventh rib, eighth and ninth ribs on the right side. - His  last PSA on 02/10/2018 was 12.04.  He reportedly had last Lupron injection 6 weeks ago at Dr. Claris Che office. - He was reportedly hospitalized for couple of days at [facility] for atrial fibrillation.  He is currently on amiodarone 200 mg daily along with Eliquis 5 mg twice daily. -We will follow-up  on his PSA from today.  We will see him back in 3 months for follow-up.  2.  Bone metastasis: We talked about initiating him on denosumab.  He was reluctant to consider it.  We will revisit this at another time.       Orders placed this encounter:  Orders Placed This Encounter  Procedures  . CBC with Differential/Platelet  . Comprehensive metabolic panel  . PSA      Derek Jack, MD [place] [phone]

## 2018-05-25 ENCOUNTER — Other Ambulatory Visit (HOSPITAL_COMMUNITY): Payer: Self-pay | Admitting: Hematology

## 2018-05-25 DIAGNOSIS — C61 Malignant neoplasm of prostate: Secondary | ICD-10-CM

## 2018-05-25 DIAGNOSIS — C7951 Secondary malignant neoplasm of bone: Principal | ICD-10-CM

## 2018-06-02 ENCOUNTER — Other Ambulatory Visit (HOSPITAL_COMMUNITY): Payer: Self-pay | Admitting: Hematology

## 2018-06-02 ENCOUNTER — Other Ambulatory Visit (HOSPITAL_COMMUNITY): Payer: Self-pay | Admitting: *Deleted

## 2018-06-02 DIAGNOSIS — C7951 Secondary malignant neoplasm of bone: Principal | ICD-10-CM

## 2018-06-02 DIAGNOSIS — C61 Malignant neoplasm of prostate: Secondary | ICD-10-CM

## 2018-06-02 MED ORDER — ZYTIGA 500 MG PO TABS: 1000.0000 mg | ORAL_TABLET | Freq: Every day | ORAL | 6 refills | Status: DC

## 2018-06-02 NOTE — Telephone Encounter (Signed)
FAXED [facility]IGA SCRIPT TO [facility] WITH 6 REFILLS

## 2018-06-02 NOTE — Telephone Encounter (Signed)
Chart reviewed, Zytiga refilled. 

## 2018-06-25 ENCOUNTER — Telehealth (HOSPITAL_COMMUNITY): Payer: Self-pay | Admitting: Hematology

## 2018-06-25 NOTE — Telephone Encounter (Signed)
PC TO [employer] @[phone] AND [phone] SPK WITH LEIGH, JENNIFER AND ANOTHER CSR. ALL ARE CONFUSED ON HOW TO OBTAIN AN EXCEPTION FOR THE PTS ZYTIGA. I WAS TOLD BY LEIGH CALL REF# [id number] THAT PTS RX WILL NOT EXPIRE TIL April 2020. ADVISED BY JENNIFER THAT A PA WAS NEEDED AND BLIND TRANSFERRED TO PA DEPT AND NO NAME CSR SUBMITTED A PA. NO RESOLUTION TO THIS ISSUE AS OF NOW. WILL CONTACT PT

## 2018-08-24 ENCOUNTER — Other Ambulatory Visit (HOSPITAL_COMMUNITY): Payer: PRIVATE HEALTH INSURANCE

## 2018-08-24 ENCOUNTER — Ambulatory Visit (HOSPITAL_COMMUNITY): Payer: PRIVATE HEALTH INSURANCE | Admitting: Hematology

## 2018-08-25 ENCOUNTER — Inpatient Hospital Stay (HOSPITAL_COMMUNITY): Payer: Medicare Other | Attending: Hematology | Admitting: Hematology

## 2018-08-25 ENCOUNTER — Other Ambulatory Visit (HOSPITAL_COMMUNITY): Payer: Self-pay | Admitting: *Deleted

## 2018-08-25 ENCOUNTER — Inpatient Hospital Stay (HOSPITAL_COMMUNITY): Payer: Medicare Other | Attending: Nurse Practitioner

## 2018-08-25 ENCOUNTER — Encounter (HOSPITAL_COMMUNITY): Payer: Self-pay | Admitting: Hematology

## 2018-08-25 VITALS — HR 52 | Temp 97.9°F | Resp 16 | Wt 249.0 lb

## 2018-08-25 DIAGNOSIS — Z7982 Long term (current) use of aspirin: Secondary | ICD-10-CM | POA: Insufficient documentation

## 2018-08-25 DIAGNOSIS — C61 Malignant neoplasm of prostate: Secondary | ICD-10-CM | POA: Insufficient documentation

## 2018-08-25 DIAGNOSIS — I4891 Unspecified atrial fibrillation: Secondary | ICD-10-CM | POA: Insufficient documentation

## 2018-08-25 DIAGNOSIS — Z7901 Long term (current) use of anticoagulants: Secondary | ICD-10-CM

## 2018-08-25 DIAGNOSIS — Z87891 Personal history of nicotine dependence: Secondary | ICD-10-CM | POA: Insufficient documentation

## 2018-08-25 DIAGNOSIS — Z79899 Other long term (current) drug therapy: Secondary | ICD-10-CM | POA: Diagnosis not present

## 2018-08-25 DIAGNOSIS — C7951 Secondary malignant neoplasm of bone: Principal | ICD-10-CM

## 2018-08-25 LAB — CBC WITH DIFFERENTIAL/PLATELET
Abs Immature Granulocytes: 0.04 10*3/uL (ref 0.00–0.07)
BASOS ABS: 0 10*3/uL (ref 0.0–0.1)
Basophils Relative: 1 %
EOS PCT: 3 %
Eosinophils Absolute: 0.3 10*3/uL (ref 0.0–0.5)
HEMATOCRIT: 38.5 % — AB (ref 39.0–52.0)
HEMOGLOBIN: 12.6 g/dL — AB (ref 13.0–17.0)
Immature Granulocytes: 1 %
LYMPHS PCT: 10 %
Lymphs Abs: 0.8 10*3/uL (ref 0.7–4.0)
MCH: 30.1 pg (ref 26.0–34.0)
MCHC: 32.7 g/dL (ref 30.0–36.0)
MCV: 92.1 fL (ref 80.0–100.0)
Monocytes Absolute: 0.8 10*3/uL (ref 0.1–1.0)
Monocytes Relative: 10 %
NRBC: 0 % (ref 0.0–0.2)
Neutro Abs: 6 10*3/uL (ref 1.7–7.7)
Neutrophils Relative %: 75 %
Platelets: 177 10*3/uL (ref 150–400)
RBC: 4.18 MIL/uL — AB (ref 4.22–5.81)
RDW: 14.7 % (ref 11.5–15.5)
WBC: 7.9 10*3/uL (ref 4.0–10.5)

## 2018-08-25 LAB — COMPREHENSIVE METABOLIC PANEL
ALBUMIN: 4.1 g/dL (ref 3.5–5.0)
ALK PHOS: 81 U/L (ref 38–126)
ALT: 16 U/L (ref 0–44)
ANION GAP: 6 (ref 5–15)
AST: 19 U/L (ref 15–41)
BILIRUBIN TOTAL: 1.1 mg/dL (ref 0.3–1.2)
BUN: 18 mg/dL (ref 8–23)
CALCIUM: 9.1 mg/dL (ref 8.9–10.3)
CO2: 26 mmol/L (ref 22–32)
Chloride: 105 mmol/L (ref 98–111)
Creatinine, Ser: 1.36 mg/dL — ABNORMAL HIGH (ref 0.61–1.24)
GFR calc Af Amer: 56 mL/min — ABNORMAL LOW (ref >60)
GFR calc non Af Amer: 48 mL/min — ABNORMAL LOW (ref >60)
Glucose, Bld: 84 mg/dL (ref 70–99)
POTASSIUM: 4.7 mmol/L (ref 3.5–5.1)
SODIUM: 137 mmol/L (ref 135–145)
TOTAL PROTEIN: 6.4 g/dL — AB (ref 6.5–8.1)

## 2018-08-25 LAB — PSA: Prostatic Specific Antigen: 10.58 ng/mL — ABNORMAL HIGH (ref 0.00–4.00)

## 2018-08-25 MED ORDER — ZYTIGA 500 MG PO TABS: 1000.0000 mg | ORAL_TABLET | Freq: Every day | ORAL | 6 refills | Status: DC

## 2018-08-25 NOTE — Progress Notes (Signed)
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  CURRENT THERAPY: Zytiga and prednisone   INTERVAL HISTORY:  Ryan Walls 85 y.o. 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 He is here today with his wife. He has no complaints at this time. Denies any nausea, vomiting, or diarrhea. Denies any new pains. Had not noticed any recent bleeding such as epistaxis, hematuria or hematochezia. Denies recent chest pain on exertion, shortness of breath on minimal exertion, pre-syncopal episodes, or palpitations. Denies any numbness or tingling in hands or feet. Denies any recent fevers, infections, or recent hospitalizations. Patient reports appetite at 100% and energy level at 75%.   REVIEW OF SYSTEMS:  Review of Systems  All other systems reviewed and are negative.    PAST MEDICAL/SURGICAL HISTORY:  Past Medical History:  Diagnosis Date  . Prostate cancer [facility])    History reviewed. No pertinent surgical history.   SOCIAL HISTORY:  Social History   Socioeconomic History  . Marital status: Married    Spouse name: Not on file  . Number of children: Not on file  . Years of education: Not on file  . Highest education level: Not on file  Occupational History  . Not on file  Social Needs  . Financial resource strain: Not on file  . Food insecurity:    Worry: Not on file    Inability: Not on file  . Transportation needs:    Medical: Not on file    Non-medical: Not on file  Tobacco Use  . Smoking status: Former Smoker    Types: Cigarettes    Last attempt to quit: 11/10/1977    Years since quitting: 40.8  . Smokeless tobacco: Never Used  Substance and Sexual Activity  . Alcohol use: Not  Currently  . Drug use: Never  . Sexual activity: Not Currently  Lifestyle  . Physical activity:    Days per week: Not on file    Minutes per session: Not on file  . Stress: Not on file  Relationships  . Social connections:    Talks on phone: Not on file    Gets together: Not on file    Attends religious service: Not on file    Active member of club or organization: Not on file    Attends meetings of clubs or organizations: Not on file    Relationship status: Not on file  . Intimate partner violence:    Fear of current or ex partner: Not on file    Emotionally abused: Not on file    Physically abused: Not on file    Forced sexual activity: Not on file  Other Topics Concern  . Not on file  Social History Narrative  . Not on file    FAMILY HISTORY:  History reviewed. No pertinent family history.  CURRENT MEDICATIONS:  Outpatient Encounter Medications as of 08/25/2018  Medication Sig  . amiodarone (PACERONE) 200 MG tablet   . aspirin 325 MG tablet Take by mouth.  Arne Cleveland 5 MG TABS tablet Take 5 mg by mouth 2 (two) times daily.  . metoprolol succinate (TOPROL-XL) 25 MG 24 hr tablet Take 25 mg by mouth every morning.  . Multiple Vitamins-Minerals (MULTIVITAL PLATINUM) TABS Take by mouth.  [name]  predniSONE (DELTASONE) 5 MG tablet Take 10 mg by mouth daily.  . TRAVATAN Z 0.004 % SOLN ophthalmic solution   . Vitamin D, Ergocalciferol, (DRISDOL) 50000 units CAPS capsule Take by mouth.  [name] ZYTIGA 500 MG tablet Take 2 tablets (1,000 mg total) by mouth daily.   No facility-administered encounter medications on file as of 08/25/2018.     ALLERGIES:  Not on File   PHYSICAL EXAM:  ECOG Performance status: 1  Vitals:   08/25/18 1117  Pulse: (!) 52  Resp: 16  Temp: 97.9 F (36.6 C)  SpO2: 100%   Filed Weights   08/25/18 1117  Weight: 249 lb (112.9 kg)    Physical Exam Constitutional:      Appearance: Normal appearance. He is normal weight.  Musculoskeletal: Normal range  of motion.  Skin:    General: Skin is warm and dry.  Neurological:     Mental Status: He is alert and oriented to person, place, and time. Mental status is at baseline.  Psychiatric:        Mood and Affect: Mood normal.        Behavior: Behavior normal.        Thought Content: Thought content normal.        Judgment: Judgment normal.   Extremities: No edema or cyanosis.   LABORATORY DATA:  I have reviewed the labs as listed.  CBC    Component Value Date/Time   WBC 7.9 08/25/2018 1019   RBC 4.18 (L) 08/25/2018 1019   HGB 12.6 (L) 08/25/2018 1019   HCT 38.5 (L) 08/25/2018 1019   PLT 177 08/25/2018 1019   MCV 92.1 08/25/2018 1019   MCH 30.1 08/25/2018 1019   MCHC 32.7 08/25/2018 1019   RDW 14.7 08/25/2018 1019   LYMPHSABS 0.8 08/25/2018 1019   MONOABS 0.8 08/25/2018 1019   EOSABS 0.3 08/25/2018 1019   BASOSABS 0.0 08/25/2018 1019   CMP Latest Ref Rng & Units 08/25/2018 05/17/2018 02/10/2018  Glucose 70 - 99 mg/dL 84 120(H) 105(H)  BUN 8 - 23 mg/dL 18 19 17   Creatinine 0.61 - 1.24 mg/dL 1.36(H) 1.26(H) 1.21  Sodium 135 - 145 mmol/L 137 140 139  Potassium 3.5 - 5.1 mmol/L 4.7 3.9 4.0  Chloride 98 - 111 mmol/L 105 109 106  CO2 22 - 32 mmol/L 26 25 27   Calcium 8.9 - 10.3 mg/dL 9.1 8.9 8.9  Total Protein 6.5 - 8.1 g/dL 6.4(L) 6.3(L) 6.4(L)  Total Bilirubin 0.3 - 1.2 mg/dL 1.1 1.1 1.1  Alkaline Phos 38 - 126 U/L 81 84 108  AST 15 - 41 U/L 19 18 19   ALT 0 - 44 U/L 16 16 15        DIAGNOSTIC IMAGING:  I have independently reviewed the scans and discussed with the patient.   I have reviewed Francene Finders, NP's note and agree with the documentation.  I personally performed a face-to-face visit, made revisions and my assessment and plan is as follows.    ASSESSMENT & PLAN:   Prostate cancer metastatic to bone ([place]) 1.  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 - He is tolerating Zytiga along with prednisone very well.  Last  scans done in July 2016 shows activity in the midshaft region of the left humerus, left calvarium, posterior right seventh rib, eighth and ninth ribs on the right side.  - Last PSA was 12.8 in October 2019. -He is not missing any doses of Zytiga.  He is having trouble with coverage of branded medication. -We have reviewed his blood work today.  It is mostly within normal limits except minimal elevation of serum creatinine to 1.39.  I have reviewed his medications.  Only amiodarone has a less than 2% chance of causing this.  He was told to hydrate well. -We will see him back in 2 months for follow-up.  We will follow-up on his PSA from today.  We will call him with results. -We will follow-up on his PSA from today.  We will see him back in 3 months for follow-up.  2.  Bone metastasis: -We talked about initiating him on denosumab.  He was reluctant to consider it.  3.  Atrial fibrillation: - He is on amiodarone 200 mg daily and Eliquis 5 mg twice daily. -He is also on Toprol-XL 25 mg daily.  This is well controlled.      Orders placed this encounter:  Orders Placed This Encounter  Procedures  . PSA  . CBC with Differential/Platelet  . Comprehensive metabolic panel      Ryan Jack, MD [place] [phone]

## 2018-08-25 NOTE — Addendum Note (Signed)
Addended by: Derek Jack on: 08/25/2018 05:26 PM   Modules accepted: Orders

## 2018-08-25 NOTE — Telephone Encounter (Signed)
Chart reviewed, zytiga refilled.

## 2018-08-25 NOTE — Assessment & Plan Note (Signed)
1.  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 - He is tolerating Zytiga along with prednisone very well.  Last scans done in July 2016 shows activity in the midshaft region of the left humerus, left calvarium, posterior right seventh rib, eighth and ninth ribs on the right side.  - Last PSA was 12.8 in October 2019. -He is not missing any doses of Zytiga.  He is having trouble with coverage of branded medication. -We have reviewed his blood work today.  It is mostly within normal limits except minimal elevation of serum creatinine to 1.39.  I have reviewed his medications.  Only amiodarone has a less than 2% chance of causing this.  He was told to hydrate well. -We will see him back in 2 months for follow-up.  We will follow-up on his PSA from today.  We will call him with results. -We will follow-up on his PSA from today.  We will see him back in 3 months for follow-up.  2.  Bone metastasis: -We talked about initiating him on denosumab.  He was reluctant to consider it.  3.  Atrial fibrillation: - He is on amiodarone 200 mg daily and Eliquis 5 mg twice daily. -He is also on Toprol-XL 25 mg daily.  This is well controlled.

## 2018-08-25 NOTE — Patient Instructions (Addendum)
[facility] at [facility] Discharge Instructions  Follow up in 2 months with labs   Thank you for choosing  Cancer Center at [facility] to provide your oncology and hematology care.  To afford each patient quality time with our provider, please arrive at least 15 minutes before your scheduled appointment time.   If you have a lab appointment with the Cancer Center please come in thru the  Main Entrance and check in at the main information desk  You need to re-schedule your appointment should you arrive 10 or more minutes late.  We strive to give you quality time with our providers, and arriving late affects you and other patients whose appointments are after yours.  Also, if you no show three or more times for appointments you may be dismissed from the clinic at the providers discretion.     Again, thank you for choosing [facility].  Our hope is that these requests will decrease the amount of time that you wait before being seen by our physicians.       _____________________________________________________________  Should you have questions after your visit to [facility], please contact our office at [phone] between the hours of 8:00 a.m. and 4:30 p.m.  Voicemails left after 4:00 p.m. will not be returned until the following business day.  For prescription refill requests, have your pharmacy contact our office and allow 72 hours.    Cancer Center Support Programs:   > Cancer Support Group  2nd Tuesday of the month 1pm-2pm, Journey Room    

## 2018-08-27 ENCOUNTER — Other Ambulatory Visit (HOSPITAL_COMMUNITY): Payer: Self-pay | Admitting: Hematology

## 2018-08-27 DIAGNOSIS — C7951 Secondary malignant neoplasm of bone: Principal | ICD-10-CM

## 2018-08-27 DIAGNOSIS — C61 Malignant neoplasm of prostate: Secondary | ICD-10-CM

## 2018-08-27 MED ORDER — ABIRATERONE ACETATE 250 MG PO TABS: 1000.0000 mg | ORAL_TABLET | Freq: Every day | ORAL | 0 refills | Status: DC

## 2018-09-09 ENCOUNTER — Telehealth (HOSPITAL_COMMUNITY): Payer: Self-pay | Admitting: Pharmacist

## 2018-09-09 ENCOUNTER — Other Ambulatory Visit (HOSPITAL_COMMUNITY): Payer: Self-pay | Admitting: *Deleted

## 2018-09-09 DIAGNOSIS — C7951 Secondary malignant neoplasm of bone: Principal | ICD-10-CM

## 2018-09-09 DIAGNOSIS — C61 Malignant neoplasm of prostate: Secondary | ICD-10-CM

## 2018-09-09 MED ORDER — ZYTIGA 500 MG PO TABS: 1000.0000 mg | ORAL_TABLET | Freq: Every day | ORAL | 6 refills | Status: DC

## 2018-09-09 NOTE — Telephone Encounter (Signed)
Oral Oncology Pharmacist Encounter  Received refill prescription for Zytiga (abiraterone) for the treatment of metastatic castration sensitive prostate cancer  in conjunction with prednisone and Lupron (per note he receives this at Dr. Claris Che office), planned duration until disease progression or unacceptable drug toxicity. This is a current therapy for [name]. He started [place] in 05/2016. He was previously using [facility] but reportedly has had a  Change in insurance and will try to use [facility].  PSA and CMP from 08/25/2018 assessed, no relevant lab abnormalities. Prescription dose and frequency assessed.   Current medication list in Epic reviewed, several DDIs with abirateroen identified: - Abiraterone may increase the serum concentration amiodarone and metoprolol. This is not a new DDI, recommend continued monitoring for increased side effects of amiodarone and metoprolol.   Prescription has been e-scribed to the [facility] for benefits analysis and approval.  Oral Oncology Clinic will continue to follow for insurance authorization and copayment issues.   Darl Pikes, PharmD, BCPS, [facility] Hematology/Oncology Clinical Pharmacist [facility]/AP Oral [facility] [phone]  09/09/2018 10:41 AM

## 2018-09-27 ENCOUNTER — Other Ambulatory Visit (HOSPITAL_COMMUNITY): Payer: Self-pay | Admitting: *Deleted

## 2018-09-27 DIAGNOSIS — C61 Malignant neoplasm of prostate: Secondary | ICD-10-CM

## 2018-09-27 DIAGNOSIS — C7951 Secondary malignant neoplasm of bone: Principal | ICD-10-CM

## 2018-09-28 ENCOUNTER — Other Ambulatory Visit: Payer: Self-pay

## 2018-09-28 ENCOUNTER — Inpatient Hospital Stay (HOSPITAL_COMMUNITY): Payer: Medicare Other

## 2018-09-28 ENCOUNTER — Inpatient Hospital Stay (HOSPITAL_COMMUNITY): Payer: Medicare Other | Attending: Hematology | Admitting: Hematology

## 2018-09-28 ENCOUNTER — Encounter (HOSPITAL_COMMUNITY): Payer: Self-pay | Admitting: Hematology

## 2018-09-28 VITALS — BP 128/64 | HR 58 | Temp 98.2°F | Resp 20 | Wt 246.5 lb

## 2018-09-28 DIAGNOSIS — I4891 Unspecified atrial fibrillation: Secondary | ICD-10-CM | POA: Insufficient documentation

## 2018-09-28 DIAGNOSIS — Z7901 Long term (current) use of anticoagulants: Secondary | ICD-10-CM | POA: Insufficient documentation

## 2018-09-28 DIAGNOSIS — C61 Malignant neoplasm of prostate: Secondary | ICD-10-CM | POA: Insufficient documentation

## 2018-09-28 DIAGNOSIS — Z87891 Personal history of nicotine dependence: Secondary | ICD-10-CM | POA: Diagnosis not present

## 2018-09-28 DIAGNOSIS — C7951 Secondary malignant neoplasm of bone: Secondary | ICD-10-CM | POA: Diagnosis not present

## 2018-09-28 DIAGNOSIS — Z79899 Other long term (current) drug therapy: Secondary | ICD-10-CM | POA: Insufficient documentation

## 2018-09-28 DIAGNOSIS — Z7982 Long term (current) use of aspirin: Secondary | ICD-10-CM | POA: Insufficient documentation

## 2018-09-28 LAB — CBC WITH DIFFERENTIAL/PLATELET
Abs Immature Granulocytes: 0.08 10*3/uL — ABNORMAL HIGH (ref 0.00–0.07)
Basophils Absolute: 0 10*3/uL (ref 0.0–0.1)
Basophils Relative: 0 %
Eosinophils Absolute: 0.1 10*3/uL (ref 0.0–0.5)
Eosinophils Relative: 1 %
HCT: 38.2 % — ABNORMAL LOW (ref 39.0–52.0)
Hemoglobin: 12.8 g/dL — ABNORMAL LOW (ref 13.0–17.0)
Immature Granulocytes: 1 %
Lymphocytes Relative: 9 %
Lymphs Abs: 0.8 10*3/uL (ref 0.7–4.0)
MCH: 30.6 pg (ref 26.0–34.0)
MCHC: 33.5 g/dL (ref 30.0–36.0)
MCV: 91.4 fL (ref 80.0–100.0)
Monocytes Absolute: 0.7 10*3/uL (ref 0.1–1.0)
Monocytes Relative: 8 %
NRBC: 0 % (ref 0.0–0.2)
Neutro Abs: 7.6 10*3/uL (ref 1.7–7.7)
Neutrophils Relative %: 81 %
Platelets: 254 10*3/uL (ref 150–400)
RBC: 4.18 MIL/uL — ABNORMAL LOW (ref 4.22–5.81)
RDW: 14.5 % (ref 11.5–15.5)
WBC: 9.4 10*3/uL (ref 4.0–10.5)

## 2018-09-28 LAB — COMPREHENSIVE METABOLIC PANEL
ALT: 14 U/L (ref 0–44)
AST: 17 U/L (ref 15–41)
Albumin: 3.8 g/dL (ref 3.5–5.0)
Alkaline Phosphatase: 80 U/L (ref 38–126)
Anion gap: 8 (ref 5–15)
BUN: 20 mg/dL (ref 8–23)
CO2: 22 mmol/L (ref 22–32)
Calcium: 9 mg/dL (ref 8.9–10.3)
Chloride: 108 mmol/L (ref 98–111)
Creatinine, Ser: 1.32 mg/dL — ABNORMAL HIGH (ref 0.61–1.24)
GFR calc Af Amer: 58 mL/min — ABNORMAL LOW (ref >60)
GFR calc non Af Amer: 50 mL/min — ABNORMAL LOW (ref >60)
Glucose, Bld: 78 mg/dL (ref 70–99)
Potassium: 4.2 mmol/L (ref 3.5–5.1)
Sodium: 138 mmol/L (ref 135–145)
Total Bilirubin: 0.8 mg/dL (ref 0.3–1.2)
Total Protein: 6.6 g/dL (ref 6.5–8.1)

## 2018-09-28 LAB — PSA: Prostatic Specific Antigen: 10.99 ng/mL — ABNORMAL HIGH (ref 0.00–4.00)

## 2018-09-28 NOTE — Assessment & Plan Note (Addendum)
1.  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 - He is tolerating Zytiga and prednisone very well. -Last scans done in July 2016 shows activity in the midshaft region of the left humerus, left calvarium, posterior right seventh rib, eighth rib and ninth rib on the right side. - Last PSA on 08/25/2018 was 10.58.  Today his PSA is 10.99.  This has come down from 12 in October 2019. - He is trying to get the medication through the [place] clinic in [place]. -He was told to see Dr. Lerry Liner in [place] to receive Lupron injection. -We will see him back in 2 months for follow-up.  2.  Bone metastasis: -We talked about initiating him on denosumab in the past.  He was reluctant to consider it.  3.  Atrial fibrillation: - He is on amiodarone 200 mg daily and Eliquis 5 mg twice daily. -He is also on Toprol-XL 25 mg daily.  This is well controlled.

## 2018-09-28 NOTE — Patient Instructions (Addendum)
[facility] at [facility] Discharge Instructions  Today you saw Dr. Katragadda   Thank you for choosing [facility] at [facility] to provide your oncology and hematology care.  To afford each patient quality time with our provider, please arrive at least 15 minutes before your scheduled appointment time.   If you have a lab appointment with the Cancer Center please come in thru the  Main Entrance and check in at the main information desk  You need to re-schedule your appointment should you arrive 10 or more minutes late.  We strive to give you quality time with our providers, and arriving late affects you and other patients whose appointments are after yours.  Also, if you no show three or more times for appointments you may be dismissed from the clinic at the providers discretion.     Again, thank you for choosing [facility].  Our hope is that these requests will decrease the amount of time that you wait before being seen by our physicians.       _____________________________________________________________  Should you have questions after your visit to [facility], please contact our office at [phone] between the hours of 8:00 a.m. and 4:30 p.m.  Voicemails left after 4:00 p.m. will not be returned until the following business day.  For prescription refill requests, have your pharmacy contact our office and allow 72 hours.    Cancer Center Support Programs:   > Cancer Support Group  2nd Tuesday of the month 1pm-2pm, Journey Room   

## 2018-09-28 NOTE — Progress Notes (Signed)
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  CURRENT THERAPY:Zytiga and prednisone   INTERVAL HISTORY:  Ryan Walls 85 y.o. 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 He is here today with his wife. He is doing well since his last visit. He has been taking the Zytiga and has been tolerating it well. Denies any nausea, vomiting, or diarrhea. Denies any new pains. Had not noticed any recent bleeding such as epistaxis, hematuria or hematochezia. Denies recent chest pain on exertion, shortness of breath on minimal exertion, pre-syncopal episodes, or palpitations. Denies any numbness or tingling in hands or feet. Denies any recent fevers, infections, or recent hospitalizations. Patient reports appetite at 100% and energy level at 100%.   REVIEW OF SYSTEMS:  Review of Systems  All other systems reviewed and are negative.    PAST MEDICAL/SURGICAL HISTORY:  Past Medical History:  Diagnosis Date  . Prostate cancer [facility])    History reviewed. No pertinent surgical history.   SOCIAL HISTORY:  Social History   Socioeconomic History  . Marital status: Married    Spouse name: Not on file  . Number of children: Not on file  . Years of education: Not on file  . Highest education level: Not on file  Occupational History  . Not on file  Social Needs  . Financial resource strain: Not on file  . Food insecurity:    Worry: Not on file    Inability: Not on file  . Transportation needs:    Medical: Not on file    Non-medical: Not on file  Tobacco Use  . Smoking status: Former Smoker    Types: Cigarettes    Last attempt to quit: 11/10/1977    Years since quitting: 40.9  . Smokeless  tobacco: Never Used  Substance and Sexual Activity  . Alcohol use: Not Currently  . Drug use: Never  . Sexual activity: Not Currently  Lifestyle  . Physical activity:    Days per week: Not on file    Minutes per session: Not on file  . Stress: Not on file  Relationships  . Social connections:    Talks on phone: Not on file    Gets together: Not on file    Attends religious service: Not on file    Active member of club or organization: Not on file    Attends meetings of clubs or organizations: Not on file    Relationship status: Not on file  . Intimate partner violence:    Fear of current or ex partner: Not on file    Emotionally abused: Not on file    Physically abused: Not on file    Forced sexual activity: Not on file  Other Topics Concern  . Not on file  Social History Narrative  . Not on file    FAMILY HISTORY:  History reviewed. No pertinent family history.  CURRENT MEDICATIONS:  Outpatient Encounter Medications as of 09/28/2018  Medication Sig  . amiodarone (PACERONE) 200 MG tablet Take 200 mg by mouth daily.   [name] aspirin 325 MG tablet Take 325 mg by mouth once.   [name] ELIQUIS 5 MG TABS tablet Take 5 mg by mouth 2 (two) times daily.  . metoprolol succinate (TOPROL-XL) 25 MG  24 hr tablet Take 25 mg by mouth every morning.  . Multiple Vitamins-Minerals (MULTIVITAL PLATINUM) TABS Take 1 tablet by mouth daily.   . predniSONE (DELTASONE) 5 MG tablet Take 10 mg by mouth daily.  . TRAVATAN Z 0.004 % SOLN ophthalmic solution Place 1 drop into both eyes at bedtime.   . Vitamin D, Ergocalciferol, (DRISDOL) 50000 units CAPS capsule Take 50,000 Units by mouth daily.   [name] ZYTIGA 500 MG tablet Take 2 tablets (1,000 mg total) by mouth daily.   No facility-administered encounter medications on file as of 09/28/2018.     ALLERGIES:  No Known Allergies   PHYSICAL EXAM:  ECOG Performance status: 1  Vitals:   09/28/18 0919  BP: 128/64  Pulse: (!) 58  Resp: 20  Temp: 98.2 F  (36.8 C)  SpO2: 99%   Filed Weights   09/28/18 0919  Weight: 246 lb 8 oz (111.8 kg)    Physical Exam Constitutional:      Appearance: Normal appearance. He is normal weight.  Musculoskeletal: Normal range of motion.  Skin:    General: Skin is warm and dry.  Neurological:     Mental Status: He is alert and oriented to person, place, and time. Mental status is at baseline.  Psychiatric:        Mood and Affect: Mood normal.        Behavior: Behavior normal.        Thought Content: Thought content normal.        Judgment: Judgment normal.      LABORATORY DATA:  I have reviewed the labs as listed.  CBC    Component Value Date/Time   WBC 9.4 09/28/2018 0857   RBC 4.18 (L) 09/28/2018 0857   HGB 12.8 (L) 09/28/2018 0857   HCT 38.2 (L) 09/28/2018 0857   PLT 254 09/28/2018 0857   MCV 91.4 09/28/2018 0857   MCH 30.6 09/28/2018 0857   MCHC 33.5 09/28/2018 0857   RDW 14.5 09/28/2018 0857   LYMPHSABS 0.8 09/28/2018 0857   MONOABS 0.7 09/28/2018 0857   EOSABS 0.1 09/28/2018 0857   BASOSABS 0.0 09/28/2018 0857   CMP Latest Ref Rng & Units 09/28/2018 08/25/2018 05/17/2018  Glucose 70 - 99 mg/dL 78 84 120(H)  BUN 8 - 23 mg/dL 20 18 19   Creatinine 0.61 - 1.24 mg/dL 1.32(H) 1.36(H) 1.26(H)  Sodium 135 - 145 mmol/L 138 137 140  Potassium 3.5 - 5.1 mmol/L 4.2 4.7 3.9  Chloride 98 - 111 mmol/L 108 105 109  CO2 22 - 32 mmol/L 22 26 25   Calcium 8.9 - 10.3 mg/dL 9.0 9.1 8.9  Total Protein 6.5 - 8.1 g/dL 6.6 6.4(L) 6.3(L)  Total Bilirubin 0.3 - 1.2 mg/dL 0.8 1.1 1.1  Alkaline Phos 38 - 126 U/L 80 81 84  AST 15 - 41 U/L 17 19 18   ALT 0 - 44 U/L 14 16 16        DIAGNOSTIC IMAGING:  I have independently reviewed the scans and discussed with the patient.   I have reviewed Ryan Finders, NP's note and agree with the documentation.  I personally performed a face-to-face visit, made revisions and my assessment and plan is as follows.    ASSESSMENT & PLAN:   Prostate cancer  metastatic to bone ([place]) 1.  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 - He is tolerating Zytiga and prednisone very well. -Last scans done in July 2016 shows activity in the midshaft region of the left humerus, left calvarium, posterior right seventh rib, eighth rib and ninth rib on the right side. - Last PSA on 08/25/2018 was 10.58.  Today his PSA is 10.99.  This has come down from 12 in October 2019. - He is trying to get the medication through the [place] [facility] in [place]. -He was told to see Dr. Lerry Liner in [place] to receive Lupron injection. -We will see him back in 2 months for follow-up.  2.  Bone metastasis: -We talked about initiating him on denosumab in the past.  He was reluctant to consider it.  3.  Atrial fibrillation: - He is on amiodarone 200 mg daily and Eliquis 5 mg twice daily. -He is also on Toprol-XL 25 mg daily.  This is well controlled.      Orders placed this encounter:  Orders Placed This Encounter  Procedures  . VITAMIN D 25 Hydroxy (Vit-D Deficiency, Fractures)  . PSA  . CBC with Differential/Platelet  . Comprehensive metabolic panel      Derek Jack, MD [place] [phone]

## 2018-09-29 LAB — VITAMIN D 25 HYDROXY (VIT D DEFICIENCY, FRACTURES): Vit D, 25-Hydroxy: 39.1 ng/mL (ref 30.0–100.0)

## 2018-09-30 ENCOUNTER — Telehealth (HOSPITAL_COMMUNITY): Payer: Self-pay | Admitting: Hematology

## 2018-09-30 NOTE — Telephone Encounter (Signed)
FAXED [place]

## 2018-10-01 ENCOUNTER — Telehealth (HOSPITAL_COMMUNITY): Payer: Self-pay | Admitting: Hematology

## 2018-10-01 ENCOUNTER — Other Ambulatory Visit (HOSPITAL_COMMUNITY): Payer: Self-pay | Admitting: *Deleted

## 2018-10-01 DIAGNOSIS — C61 Malignant neoplasm of prostate: Secondary | ICD-10-CM

## 2018-10-01 DIAGNOSIS — C7951 Secondary malignant neoplasm of bone: Principal | ICD-10-CM

## 2018-10-01 MED ORDER — ZYTIGA 500 MG PO TABS: 1000.0000 mg | ORAL_TABLET | Freq: Every day | ORAL | 6 refills | Status: DC

## 2018-10-01 NOTE — Telephone Encounter (Signed)
FAXED [place]

## 2018-10-11 ENCOUNTER — Encounter (HOSPITAL_COMMUNITY): Payer: Self-pay | Admitting: *Deleted

## 2018-10-21 ENCOUNTER — Telehealth (HOSPITAL_COMMUNITY): Payer: Self-pay | Admitting: Hematology

## 2018-10-21 ENCOUNTER — Other Ambulatory Visit (HOSPITAL_COMMUNITY): Payer: Self-pay | Admitting: *Deleted

## 2018-10-21 MED ORDER — ABIRATERONE ACETATE 250 MG PO TABS: 1000.0000 mg | ORAL_TABLET | Freq: Every day | ORAL | 0 refills | Status: DC

## 2018-10-21 NOTE — Telephone Encounter (Signed)
Patient has approval for generic drug coverage now that it is available. I spoke with Dr. Delton Coombes and he said to send the prescription for generic to the [place].  Orders are in.

## 2018-10-21 NOTE — Telephone Encounter (Signed)
Faxed Zytiga rx to the [place]

## 2018-10-25 ENCOUNTER — Encounter (HOSPITAL_COMMUNITY): Payer: Self-pay | Admitting: *Deleted

## 2018-10-25 NOTE — Progress Notes (Signed)
Patient called clinic today and stated that he has received the abiraterone and will begin taking it tomorrow.

## 2018-10-26 ENCOUNTER — Other Ambulatory Visit (HOSPITAL_COMMUNITY): Payer: PRIVATE HEALTH INSURANCE

## 2018-10-26 ENCOUNTER — Ambulatory Visit (HOSPITAL_COMMUNITY): Payer: PRIVATE HEALTH INSURANCE | Admitting: Hematology

## 2018-10-27 ENCOUNTER — Ambulatory Visit (HOSPITAL_COMMUNITY): Payer: PRIVATE HEALTH INSURANCE | Admitting: Hematology

## 2018-10-27 ENCOUNTER — Other Ambulatory Visit (HOSPITAL_COMMUNITY): Payer: PRIVATE HEALTH INSURANCE

## 2018-10-28 ENCOUNTER — Other Ambulatory Visit (HOSPITAL_COMMUNITY): Payer: PRIVATE HEALTH INSURANCE

## 2018-10-28 ENCOUNTER — Ambulatory Visit (HOSPITAL_COMMUNITY): Payer: PRIVATE HEALTH INSURANCE | Admitting: Hematology

## 2018-11-04 ENCOUNTER — Other Ambulatory Visit (HOSPITAL_COMMUNITY): Payer: Self-pay | Admitting: *Deleted

## 2018-11-04 ENCOUNTER — Encounter (HOSPITAL_COMMUNITY): Payer: Self-pay | Admitting: *Deleted

## 2018-11-04 DIAGNOSIS — C61 Malignant neoplasm of prostate: Secondary | ICD-10-CM

## 2018-11-04 DIAGNOSIS — C7951 Secondary malignant neoplasm of bone: Principal | ICD-10-CM

## 2018-11-04 MED ORDER — ZYTIGA 500 MG PO TABS: 1000.0000 mg | ORAL_TABLET | Freq: Every day | ORAL | 6 refills | Status: DC

## 2018-11-04 NOTE — Progress Notes (Signed)
Patient has been taking abiraterone since 3/24.  He states that he has been nauseated and had some vomiting.  Vomiting is controlled with nausea medication, but the nausea comes in waves.  Patient c/o generalized weakness.  States that he hasn't had his normal energy and also states that he has had some diarrhea.  Patient states that these symptoms didn't start until he began taking the new medication.  I have spoke with provider and he wants patient to resume taking Zytiga since he can not tolerate the generic.  I have called and spoke with patient. I advised him of the nausea medication and how to take imodium.  Patient verbalizes understanding.

## 2018-11-24 ENCOUNTER — Ambulatory Visit (HOSPITAL_COMMUNITY): Payer: Medicare Other | Admitting: Hematology

## 2018-11-24 ENCOUNTER — Other Ambulatory Visit (HOSPITAL_COMMUNITY): Payer: Medicare Other

## 2018-11-24 ENCOUNTER — Encounter (HOSPITAL_COMMUNITY): Payer: Self-pay | Admitting: Hematology

## 2018-11-24 ENCOUNTER — Telehealth (HOSPITAL_COMMUNITY): Payer: Self-pay | Admitting: Hematology

## 2018-11-24 ENCOUNTER — Inpatient Hospital Stay (HOSPITAL_COMMUNITY): Payer: Medicare Other | Attending: Hematology | Admitting: Hematology

## 2018-11-24 ENCOUNTER — Inpatient Hospital Stay (HOSPITAL_COMMUNITY): Payer: Medicare Other

## 2018-11-24 ENCOUNTER — Other Ambulatory Visit: Payer: Self-pay

## 2018-11-24 DIAGNOSIS — C61 Malignant neoplasm of prostate: Secondary | ICD-10-CM

## 2018-11-24 DIAGNOSIS — Z7982 Long term (current) use of aspirin: Secondary | ICD-10-CM | POA: Diagnosis not present

## 2018-11-24 DIAGNOSIS — Z79899 Other long term (current) drug therapy: Secondary | ICD-10-CM | POA: Diagnosis not present

## 2018-11-24 DIAGNOSIS — Z191 Hormone sensitive malignancy status: Secondary | ICD-10-CM | POA: Diagnosis not present

## 2018-11-24 DIAGNOSIS — C7951 Secondary malignant neoplasm of bone: Principal | ICD-10-CM

## 2018-11-24 DIAGNOSIS — Z87891 Personal history of nicotine dependence: Secondary | ICD-10-CM | POA: Diagnosis not present

## 2018-11-24 DIAGNOSIS — Z7901 Long term (current) use of anticoagulants: Secondary | ICD-10-CM

## 2018-11-24 DIAGNOSIS — I4891 Unspecified atrial fibrillation: Secondary | ICD-10-CM | POA: Diagnosis not present

## 2018-11-24 LAB — CBC WITH DIFFERENTIAL/PLATELET
Abs Immature Granulocytes: 0.04 10*3/uL (ref 0.00–0.07)
Basophils Absolute: 0 10*3/uL (ref 0.0–0.1)
Basophils Relative: 0 %
Eosinophils Absolute: 0.2 10*3/uL (ref 0.0–0.5)
Eosinophils Relative: 2 %
HCT: 37.7 % — ABNORMAL LOW (ref 39.0–52.0)
Hemoglobin: 12.7 g/dL — ABNORMAL LOW (ref 13.0–17.0)
Immature Granulocytes: 1 %
Lymphocytes Relative: 10 %
Lymphs Abs: 0.8 10*3/uL (ref 0.7–4.0)
MCH: 31 pg (ref 26.0–34.0)
MCHC: 33.7 g/dL (ref 30.0–36.0)
MCV: 92 fL (ref 80.0–100.0)
Monocytes Absolute: 0.7 10*3/uL (ref 0.1–1.0)
Monocytes Relative: 9 %
Neutro Abs: 6.5 10*3/uL (ref 1.7–7.7)
Neutrophils Relative %: 78 %
Platelets: 186 10*3/uL (ref 150–400)
RBC: 4.1 MIL/uL — ABNORMAL LOW (ref 4.22–5.81)
RDW: 14.2 % (ref 11.5–15.5)
WBC: 8.3 10*3/uL (ref 4.0–10.5)
nRBC: 0 % (ref 0.0–0.2)

## 2018-11-24 LAB — COMPREHENSIVE METABOLIC PANEL
ALT: 14 U/L (ref 0–44)
AST: 18 U/L (ref 15–41)
Albumin: 4.2 g/dL (ref 3.5–5.0)
Alkaline Phosphatase: 73 U/L (ref 38–126)
Anion gap: 7 (ref 5–15)
BUN: 18 mg/dL (ref 8–23)
CO2: 25 mmol/L (ref 22–32)
Calcium: 9 mg/dL (ref 8.9–10.3)
Chloride: 106 mmol/L (ref 98–111)
Creatinine, Ser: 1.32 mg/dL — ABNORMAL HIGH (ref 0.61–1.24)
GFR calc Af Amer: 58 mL/min — ABNORMAL LOW (ref >60)
GFR calc non Af Amer: 50 mL/min — ABNORMAL LOW (ref >60)
Glucose, Bld: 83 mg/dL (ref 70–99)
Potassium: 4.1 mmol/L (ref 3.5–5.1)
Sodium: 138 mmol/L (ref 135–145)
Total Bilirubin: 1.2 mg/dL (ref 0.3–1.2)
Total Protein: 6.4 g/dL — ABNORMAL LOW (ref 6.5–8.1)

## 2018-11-24 LAB — PSA: Prostatic Specific Antigen: 8.38 ng/mL — ABNORMAL HIGH (ref 0.00–4.00)

## 2018-11-24 NOTE — Patient Instructions (Addendum)
[facility] at [facility] Discharge Instructions  You were seen today by Dr. Katragadda. He went over your recent lab results. He will see you back in 2 months for labs and follow up.   Thank you for choosing [facility] at [facility] to provide your oncology and hematology care.  To afford each patient quality time with our provider, please arrive at least 15 minutes before your scheduled appointment time.   If you have a lab appointment with the Cancer Center please come in thru the  Main Entrance and check in at the main information desk  You need to re-schedule your appointment should you arrive 10 or more minutes late.  We strive to give you quality time with our providers, and arriving late affects you and other patients whose appointments are after yours.  Also, if you no show three or more times for appointments you may be dismissed from the clinic at the providers discretion.     Again, thank you for choosing [facility].  Our hope is that these requests will decrease the amount of time that you wait before being seen by our physicians.       _____________________________________________________________  Should you have questions after your visit to [facility], please contact our office at [phone] between the hours of 8:00 a.m. and 4:30 p.m.  Voicemails left after 4:00 p.m. will not be returned until the following business day.  For prescription refill requests, have your pharmacy contact our office and allow 72 hours.    Cancer Center Support Programs:   > Cancer Support Group  2nd Tuesday of the month 1pm-2pm, Journey Room    

## 2018-11-24 NOTE — Progress Notes (Signed)
[place], 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  CURRENT THERAPY:Zytiga and prednisone     INTERVAL HISTORY:  Ryan Walls 85 y.o. male returns for routine follow-up. He is here today alone. He states that he has been taking is Zytiga and prednisone as directed with no missed doses. He was educated on the importance of getting his Lupron injections.  Denies any nausea, vomiting, or diarrhea. Denies any new pains. Had not noticed any recent bleeding such as epistaxis, hematuria or hematochezia. Denies recent chest pain on exertion, shortness of breath on minimal exertion, pre-syncopal episodes, or palpitations. Denies any numbness or tingling in hands or feet. Denies any recent fevers, infections, or recent hospitalizations. Patient reports appetite at 100% and energy level at 50%.    REVIEW OF SYSTEMS:  Review of Systems  All other systems reviewed and are negative.    PAST MEDICAL/SURGICAL HISTORY:  Past Medical History:  Diagnosis Date  . Prostate cancer [facility])    History reviewed. No pertinent surgical history.   SOCIAL HISTORY:  Social History   Socioeconomic History  . Marital status: Married    Spouse name: Not on file  . Number of children: Not on file  . Years of education: Not on file  . Highest education level: Not on file  Occupational History  . Not on file  Social Needs  . Financial resource strain: Not on file  . Food insecurity:    Worry: Not on file    Inability: Not on file  . Transportation needs:    Medical: Not on file    Non-medical: Not on file  Tobacco Use  . Smoking status: Former Smoker    Types: Cigarettes    Last attempt to quit: 11/10/1977    Years since quitting: 41.0  . Smokeless tobacco:  Never Used  Substance and Sexual Activity  . Alcohol use: Not Currently  . Drug use: Never  . Sexual activity: Not Currently  Lifestyle  . Physical activity:    Days per week: Not on file    Minutes per session: Not on file  . Stress: Not on file  Relationships  . Social connections:    Talks on phone: Not on file    Gets together: Not on file    Attends religious service: Not on file    Active member of club or organization: Not on file    Attends meetings of clubs or organizations: Not on file    Relationship status: Not on file  . Intimate partner violence:    Fear of current or ex partner: Not on file    Emotionally abused: Not on file    Physically abused: Not on file    Forced sexual activity: Not on file  Other Topics Concern  . Not on file  Social History Narrative  . Not on file    FAMILY HISTORY:  History reviewed. No pertinent family history.  CURRENT MEDICATIONS:  Outpatient Encounter Medications as of 11/24/2018  Medication Sig  . amiodarone (PACERONE) 200 MG tablet Take 200 mg by mouth daily.   [name] ELIQUIS 5 MG TABS tablet Take 5 mg by mouth 2 (two) times daily.  . metoprolol succinate (TOPROL-XL) 25 MG 24 hr tablet Take 25 mg by mouth every morning.  [name]  predniSONE (DELTASONE) 5 MG tablet Take 10 mg by mouth daily.  . TRAVATAN Z 0.004 % SOLN ophthalmic solution Place 1 drop into both eyes at bedtime.   . Vitamin D, Ergocalciferol, (DRISDOL) 50000 units CAPS capsule Take 50,000 Units by mouth daily.   [name] ZYTIGA 500 MG tablet Take 2 tablets (1,000 mg total) by mouth daily.  [name] aspirin 325 MG tablet Take 325 mg by mouth once.   . Multiple Vitamins-Minerals (MULTIVITAL PLATINUM) TABS Take 1 tablet by mouth daily.    No facility-administered encounter medications on file as of 11/24/2018.     ALLERGIES:  No Known Allergies   PHYSICAL EXAM:  ECOG Performance status: 1  Vitals:   11/24/18 1210  BP: 104/69  Pulse: 73  Resp: 18  Temp: 98.1 F (36.7 C)   SpO2: 97%   Filed Weights   11/24/18 1210  Weight: 252 lb (114.3 kg)    Physical Exam Vitals signs reviewed.  Constitutional:      Appearance: Normal appearance.  Cardiovascular:     Rate and Rhythm: Normal rate and regular rhythm.     Heart sounds: Normal heart sounds.  Pulmonary:     Effort: Pulmonary effort is normal.     Breath sounds: Normal breath sounds.  Abdominal:     General: There is no distension.     Palpations: Abdomen is soft. There is no mass.  Musculoskeletal:        General: No swelling.  Skin:    General: Skin is warm.  Neurological:     General: No focal deficit present.     Mental Status: He is alert and oriented to person, place, and time.  Psychiatric:        Mood and Affect: Mood normal.        Behavior: Behavior normal.      LABORATORY DATA:  I have reviewed the labs as listed.  CBC    Component Value Date/Time   WBC 8.3 11/24/2018 1049   RBC 4.10 (L) 11/24/2018 1049   HGB 12.7 (L) 11/24/2018 1049   HCT 37.7 (L) 11/24/2018 1049   PLT 186 11/24/2018 1049   MCV 92.0 11/24/2018 1049   MCH 31.0 11/24/2018 1049   MCHC 33.7 11/24/2018 1049   RDW 14.2 11/24/2018 1049   LYMPHSABS 0.8 11/24/2018 1049   MONOABS 0.7 11/24/2018 1049   EOSABS 0.2 11/24/2018 1049   BASOSABS 0.0 11/24/2018 1049   CMP Latest Ref Rng & Units 11/24/2018 09/28/2018 08/25/2018  Glucose 70 - 99 mg/dL 83 78 84  BUN 8 - 23 mg/dL 18 20 18   Creatinine 0.61 - 1.24 mg/dL 1.32(H) 1.32(H) 1.36(H)  Sodium 135 - 145 mmol/L 138 138 137  Potassium 3.5 - 5.1 mmol/L 4.1 4.2 4.7  Chloride 98 - 111 mmol/L 106 108 105  CO2 22 - 32 mmol/L 25 22 26   Calcium 8.9 - 10.3 mg/dL 9.0 9.0 9.1  Total Protein 6.5 - 8.1 g/dL 6.4(L) 6.6 6.4(L)  Total Bilirubin 0.3 - 1.2 mg/dL 1.2 0.8 1.1  Alkaline Phos 38 - 126 U/L 73 80 81  AST 15 - 41 U/L 18 17 19   ALT 0 - 44 U/L 14 14 16        DIAGNOSTIC IMAGING:  I have independently reviewed the scans and discussed with the patient.   I have  reviewed Venita Lick LPN's note and agree with the documentation.  I personally performed a face-to-face visit, made revisions and my assessment and plan is as follows.  ASSESSMENT & PLAN:   Prostate cancer metastatic to bone ([place]) 1.  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 - He is tolerating Zytiga and prednisone very well. -Last scans done in July 2016 shows activity in the midshaft region of the left humerus, left calvarium, posterior right seventh rib, eighth rib and ninth rib on the right side. - He has tried taking generic Abiraterone for 1 to 2 weeks and experience severe nausea, vomiting and diarrhea.  He stopped taking it. - We will talk to [facility] and try to obtain brand Zytiga. -His PSA today improved to 8.38.  He was told to take Lupron shots with Dr. Lerry Liner in [place]. -I will see him back in 2 months for follow-up.  2.  Bone metastasis: -We had discussed about denosumab in the past.  He was reluctant to consider it.  3.  Atrial fibrillation: - He is on amiodarone 200 mg daily and Eliquis 5 mg twice daily. -He is also on Toprol-XL 25 mg daily.  This is well controlled.  Time spent is 25 minutes with more than 50% of the time spent face-to-face discussing treatment plan and coordination of care.    Orders placed this encounter:  No orders of the defined types were placed in this encounter.     Derek Jack, MD [place] [phone]

## 2018-11-24 NOTE — Assessment & Plan Note (Addendum)
1.  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 - He is tolerating Zytiga and prednisone very well. -Last scans done in July 2016 shows activity in the midshaft region of the left humerus, left calvarium, posterior right seventh rib, eighth rib and ninth rib on the right side. - He has tried taking generic Abiraterone for 1 to 2 weeks and experience severe nausea, vomiting and diarrhea.  He stopped taking it. - We will talk to [facility] and try to obtain brand Zytiga. -His PSA today improved to 8.38.  He was told to take Lupron shots with Dr. Lerry Liner in [place]. -I will see him back in 2 months for follow-up.  2.  Bone metastasis: -We had discussed about denosumab in the past.  He was reluctant to consider it.  3.  Atrial fibrillation: - He is on amiodarone 200 mg daily and Eliquis 5 mg twice daily. -He is also on Toprol-XL 25 mg daily.  This is well controlled.

## 2018-11-25 LAB — VITAMIN D 25 HYDROXY (VIT D DEFICIENCY, FRACTURES): Vit D, 25-Hydroxy: 50.7 ng/mL (ref 30.0–100.0)

## 2018-12-03 ENCOUNTER — Other Ambulatory Visit (HOSPITAL_COMMUNITY): Payer: Self-pay | Admitting: *Deleted

## 2018-12-03 DIAGNOSIS — C61 Malignant neoplasm of prostate: Secondary | ICD-10-CM

## 2018-12-03 DIAGNOSIS — C7951 Secondary malignant neoplasm of bone: Principal | ICD-10-CM

## 2018-12-03 MED ORDER — ZYTIGA 500 MG PO TABS: 1000.0000 mg | ORAL_TABLET | Freq: Every day | ORAL | 6 refills | Status: DC

## 2018-12-06 ENCOUNTER — Telehealth (HOSPITAL_COMMUNITY): Payer: Self-pay | Admitting: Hematology

## 2018-12-06 ENCOUNTER — Telehealth (HOSPITAL_COMMUNITY): Payer: Self-pay | Admitting: Pharmacist

## 2018-12-06 DIAGNOSIS — C7951 Secondary malignant neoplasm of bone: Principal | ICD-10-CM

## 2018-12-06 DIAGNOSIS — C61 Malignant neoplasm of prostate: Secondary | ICD-10-CM

## 2018-12-06 MED ORDER — ZYTIGA 500 MG PO TABS: 1000.0000 mg | ORAL_TABLET | Freq: Every day | ORAL | 6 refills | Status: DC

## 2018-12-06 NOTE — Telephone Encounter (Signed)
Oral Oncology Pharmacist Encounter   Patient is switching from the [place] pharmacy to filling at [place]. Refill prescription for Zytiga sent to [facility] for the treatment of metastatic prostate cancer in conjunction with prednisone and Lupron, planned duration until disease progression or unacceptable drug toxicity. Medication started 05/2016.  Patient previously filling abiraterone at the [place] [place], but they are unable to provide branding Zytiga so that patient is switching to [facility].  Prescription dose and frequency assessed.    Current medication list in Epic reviewed, one DDIs with Zytiga identified: - Zytiga may increase the serum concentration amiodarone and metoprolol. This is not a new DDI, recommend continued monitoring for increased side effects of amiodarone and metoprolol.    Oral Oncology Clinic will continue to follow patient.   Darl Pikes, PharmD, BCPS, [facility] Hematology/Oncology Clinical Pharmacist [facility] [phone]  12/06/2018 11:38 AM

## 2018-12-06 NOTE — Telephone Encounter (Signed)
Faxed Zytiga rx to [place].

## 2018-12-07 NOTE — Telephone Encounter (Signed)
Oral Chemotherapy Pharmacist Encounter   Bethena Roys, patient advocate, reached out to Mr. Forlenza to set him up with [place]. He stated he would prefer to fill at [facility]. Bethena Roys will reach out to [facility] to make sure there are no issues with filling his Zytiga.   Darl Pikes, PharmD, BCPS, [facility] Hematology/Oncology Clinical Pharmacist [facility] Oral [facility] [phone]  12/07/2018 3:06 PM

## 2019-01-28 ENCOUNTER — Other Ambulatory Visit: Payer: Self-pay

## 2019-01-28 ENCOUNTER — Other Ambulatory Visit (HOSPITAL_COMMUNITY): Payer: Self-pay | Admitting: *Deleted

## 2019-01-28 DIAGNOSIS — C61 Malignant neoplasm of prostate: Secondary | ICD-10-CM

## 2019-01-28 DIAGNOSIS — C7951 Secondary malignant neoplasm of bone: Secondary | ICD-10-CM

## 2019-01-31 ENCOUNTER — Inpatient Hospital Stay (HOSPITAL_COMMUNITY): Payer: Medicare Other

## 2019-01-31 ENCOUNTER — Inpatient Hospital Stay (HOSPITAL_COMMUNITY): Payer: Medicare Other | Attending: Hematology | Admitting: Hematology

## 2019-01-31 ENCOUNTER — Other Ambulatory Visit: Payer: Self-pay

## 2019-01-31 ENCOUNTER — Encounter (HOSPITAL_COMMUNITY): Payer: Self-pay | Admitting: Hematology

## 2019-01-31 DIAGNOSIS — C61 Malignant neoplasm of prostate: Secondary | ICD-10-CM | POA: Insufficient documentation

## 2019-01-31 DIAGNOSIS — Z87891 Personal history of nicotine dependence: Secondary | ICD-10-CM | POA: Insufficient documentation

## 2019-01-31 DIAGNOSIS — C7951 Secondary malignant neoplasm of bone: Secondary | ICD-10-CM | POA: Diagnosis not present

## 2019-01-31 DIAGNOSIS — Z79899 Other long term (current) drug therapy: Secondary | ICD-10-CM | POA: Diagnosis not present

## 2019-01-31 DIAGNOSIS — I4891 Unspecified atrial fibrillation: Secondary | ICD-10-CM | POA: Insufficient documentation

## 2019-01-31 DIAGNOSIS — Z191 Hormone sensitive malignancy status: Secondary | ICD-10-CM | POA: Diagnosis not present

## 2019-01-31 LAB — COMPREHENSIVE METABOLIC PANEL
ALT: 15 U/L (ref 0–44)
AST: 20 U/L (ref 15–41)
Albumin: 4.2 g/dL (ref 3.5–5.0)
Alkaline Phosphatase: 85 U/L (ref 38–126)
Anion gap: 10 (ref 5–15)
BUN: 18 mg/dL (ref 8–23)
CO2: 23 mmol/L (ref 22–32)
Calcium: 9.2 mg/dL (ref 8.9–10.3)
Chloride: 105 mmol/L (ref 98–111)
Creatinine, Ser: 1.34 mg/dL — ABNORMAL HIGH (ref 0.61–1.24)
GFR calc Af Amer: 56 mL/min — ABNORMAL LOW (ref >60)
GFR calc non Af Amer: 49 mL/min — ABNORMAL LOW (ref >60)
Glucose, Bld: 94 mg/dL (ref 70–99)
Potassium: 4.1 mmol/L (ref 3.5–5.1)
Sodium: 138 mmol/L (ref 135–145)
Total Bilirubin: 1.4 mg/dL — ABNORMAL HIGH (ref 0.3–1.2)
Total Protein: 6.6 g/dL (ref 6.5–8.1)

## 2019-01-31 LAB — CBC WITH DIFFERENTIAL/PLATELET
Abs Immature Granulocytes: 0.05 10*3/uL (ref 0.00–0.07)
Basophils Absolute: 0 10*3/uL (ref 0.0–0.1)
Basophils Relative: 1 %
Eosinophils Absolute: 0.3 10*3/uL (ref 0.0–0.5)
Eosinophils Relative: 3 %
HCT: 36.7 % — ABNORMAL LOW (ref 39.0–52.0)
Hemoglobin: 12.4 g/dL — ABNORMAL LOW (ref 13.0–17.0)
Immature Granulocytes: 1 %
Lymphocytes Relative: 10 %
Lymphs Abs: 0.8 10*3/uL (ref 0.7–4.0)
MCH: 30.8 pg (ref 26.0–34.0)
MCHC: 33.8 g/dL (ref 30.0–36.0)
MCV: 91.3 fL (ref 80.0–100.0)
Monocytes Absolute: 0.7 10*3/uL (ref 0.1–1.0)
Monocytes Relative: 9 %
Neutro Abs: 6.4 10*3/uL (ref 1.7–7.7)
Neutrophils Relative %: 76 %
Platelets: 181 10*3/uL (ref 150–400)
RBC: 4.02 MIL/uL — ABNORMAL LOW (ref 4.22–5.81)
RDW: 14.5 % (ref 11.5–15.5)
WBC: 8.3 10*3/uL (ref 4.0–10.5)
nRBC: 0 % (ref 0.0–0.2)

## 2019-01-31 LAB — PSA: Prostatic Specific Antigen: 9.03 ng/mL — ABNORMAL HIGH (ref 0.00–4.00)

## 2019-01-31 NOTE — Patient Instructions (Addendum)
[place] at [facility] Discharge Instructions  You were seen today by Dr. Delton Coombes. He went over your recent lab results. He will see you back in 8 weeks for labs, Lupron and follow up.   Thank you for choosing [place] at [facility] to provide your oncology and hematology care.  To afford each patient quality time with our provider, please arrive at least 15 minutes before your scheduled appointment time.   If you have a lab appointment with the [place] please come in thru the  Main Entrance and check in at the main information desk  You need to re-schedule your appointment should you arrive 10 or more minutes late.  We strive to give you quality time with our providers, and arriving late affects you and other patients whose appointments are after yours.  Also, if you no show three or more times for appointments you may be dismissed from the clinic at the providers discretion.     Again, thank you for choosing [facility].  Our hope is that these requests will decrease the amount of time that you wait before being seen by our physicians.       _____________________________________________________________  Should you have questions after your visit to [facility], please contact our office at [phone] between the hours of 8:00 a.m. and 4:30 p.m.  Voicemails left after 4:00 p.m. will not be returned until the following business day.  For prescription refill requests, have your pharmacy contact our office and allow 72 hours.    Cancer Center Support Programs:   > Cancer Support Group  2nd Tuesday of the month 1pm-2pm, Journey Room

## 2019-01-31 NOTE — Assessment & Plan Note (Signed)
1.  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  Prostate biopsy on 02/06/2015 showed adenocarcinoma, Gleason 4+5 is equal to 9. -Zytiga and prednisone started on 05/05/2016. -Last bone scan in July 2016 shows activity in the midshaft region of the left humerus, left calvarium, posterior right seventh rib, eighth rib, ninth rib on the right side. - He is continuing to tolerate Zytiga 500 mg 2 tablets daily along with prednisone 10 mg daily. - Last PSA was 8.38 on 11/24/2018. - His last Lupron injection was in the first week of March with Dr. Lerry Liner in [place]. - He wanted to continue injections with [place].  Hence I will put in Lupron injection at next visit. - We will call him with his PSA results from today.  He will be seen back in 2 months for follow-up.  2.  Bone metastasis: - We have discussed about denosumab in the past.  He was reluctant to consider it. -He is taking vitamin D 5 50,000 units weekly.  We will follow-up on the level from today.  3.  Atrial fibrillation: - He will continue amiodarone 200 mg daily.  He also takes Eliquis 5 mg twice daily. -Rate is well controlled on Toprol-XL 25 mg daily.

## 2019-01-31 NOTE — Progress Notes (Signed)
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  CURRENT THERAPY:Zytiga and prednisone     INTERVAL HISTORY:  Ryan Walls 85 y.o. 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 He is tolerating Zytiga 500 mg 2 pills daily very well.  Denies any major hot flashes.  Denies any severe fatigue.  No falls reported.  He is taking prednisone 10 mg daily.  Last Lupron injection was in the first week of March.  Is taking vitamin D 50,000 units weekly.  Appetite is 100%.  Energy levels are 50%.  Denies any major hot flashes.  No new onset bone pains.  No recent infections or ER visits.    REVIEW OF SYSTEMS:  Review of Systems  All other systems reviewed and are negative.    PAST MEDICAL/SURGICAL HISTORY:  Past Medical History:  Diagnosis Date  . Prostate cancer [facility])    History reviewed. No pertinent surgical history.   SOCIAL HISTORY:  Social History   Socioeconomic History  . Marital status: Married    Spouse name: Not on file  . Number of children: Not on file  . Years of education: Not on file  . Highest education level: Not on file  Occupational History  . Not on file  Social Needs  . Financial resource strain: Not on file  . Food insecurity    Worry: Not on file    Inability: Not on file  . Transportation needs    Medical: Not on file    Non-medical: Not on file  Tobacco Use  . Smoking status: Former Smoker    Types: Cigarettes    Quit date: 11/10/1977    Years since quitting: 41.2  . Smokeless tobacco: Never Used  Substance and Sexual Activity  . Alcohol use: Not Currently  . Drug use: Never  . Sexual activity: Not Currently  Lifestyle  . Physical activity    Days per week:  Not on file    Minutes per session: Not on file  . Stress: Not on file  Relationships  . Social Herbalist on phone: Not on file    Gets together: Not on file    Attends religious service: Not on file    Active member of club or organization: Not on file    Attends meetings of clubs or organizations: Not on file    Relationship status: Not on file  . Intimate partner violence    Fear of current or ex partner: Not on file    Emotionally abused: Not on file    Physically abused: Not on file    Forced sexual activity: Not on file  Other Topics Concern  . Not on file  Social History Narrative  . Not on file    FAMILY HISTORY:  History reviewed. No pertinent family history.  CURRENT MEDICATIONS:  Outpatient Encounter Medications as of 01/31/2019  Medication Sig  . amiodarone (PACERONE) 200 MG tablet Take 200 mg by mouth daily.   [name] ELIQUIS 5 MG TABS tablet Take 5 mg by mouth 2 (two) times daily.  . metoprolol succinate (TOPROL-XL) 25 MG 24 hr tablet Take 25 mg by mouth every morning.  . predniSONE (DELTASONE) 5 MG tablet Take 10 mg by mouth daily.  [name]  TRAVATAN Z 0.004 % SOLN ophthalmic solution Place 1 drop into both eyes at bedtime.   . Vitamin D, Ergocalciferol, (DRISDOL) 50000 units CAPS capsule Take 50,000 Units by mouth daily.   [name] ZYTIGA 500 MG tablet Take 2 tablets (1,000 mg total) by mouth daily.  . [DISCONTINUED] aspirin 325 MG tablet Take 325 mg by mouth once.   . [DISCONTINUED] Multiple Vitamins-Minerals (MULTIVITAL PLATINUM) TABS Take 1 tablet by mouth daily.    No facility-administered encounter medications on file as of 01/31/2019.     ALLERGIES:  No Known Allergies   PHYSICAL EXAM:  ECOG Performance status: 1  Vitals:   01/31/19 1100  BP: 140/62  Pulse: 63  Resp: 16  Temp: (!) 96.9 F (36.1 C)  SpO2: 98%   Filed Weights   01/31/19 1100  Weight: 255 lb 5 oz (115.8 kg)    Physical Exam Vitals signs reviewed.  Constitutional:       Appearance: Normal appearance.  Cardiovascular:     Rate and Rhythm: Normal rate and regular rhythm.     Heart sounds: Normal heart sounds.  Pulmonary:     Effort: Pulmonary effort is normal.     Breath sounds: Normal breath sounds.  Abdominal:     General: There is no distension.     Palpations: Abdomen is soft. There is no mass.  Musculoskeletal:        General: No swelling.  Skin:    General: Skin is warm.  Neurological:     General: No focal deficit present.     Mental Status: He is alert and oriented to person, place, and time.  Psychiatric:        Mood and Affect: Mood normal.        Behavior: Behavior normal.      LABORATORY DATA:  I have reviewed the labs as listed.  CBC    Component Value Date/Time   WBC 8.3 01/31/2019 1030   RBC 4.02 (L) 01/31/2019 1030   HGB 12.4 (L) 01/31/2019 1030   HCT 36.7 (L) 01/31/2019 1030   PLT 181 01/31/2019 1030   MCV 91.3 01/31/2019 1030   MCH 30.8 01/31/2019 1030   MCHC 33.8 01/31/2019 1030   RDW 14.5 01/31/2019 1030   LYMPHSABS 0.8 01/31/2019 1030   MONOABS 0.7 01/31/2019 1030   EOSABS 0.3 01/31/2019 1030   BASOSABS 0.0 01/31/2019 1030   CMP Latest Ref Rng & Units 01/31/2019 11/24/2018 09/28/2018  Glucose 70 - 99 mg/dL 94 83 78  BUN 8 - 23 mg/dL 18 18 20   Creatinine 0.61 - 1.24 mg/dL 1.34(H) 1.32(H) 1.32(H)  Sodium 135 - 145 mmol/L 138 138 138  Potassium 3.5 - 5.1 mmol/L 4.1 4.1 4.2  Chloride 98 - 111 mmol/L 105 106 108  CO2 22 - 32 mmol/L 23 25 22   Calcium 8.9 - 10.3 mg/dL 9.2 9.0 9.0  Total Protein 6.5 - 8.1 g/dL 6.6 6.4(L) 6.6  Total Bilirubin 0.3 - 1.2 mg/dL 1.4(H) 1.2 0.8  Alkaline Phos 38 - 126 U/L 85 73 80  AST 15 - 41 U/L 20 18 17   ALT 0 - 44 U/L 15 14 14        DIAGNOSTIC IMAGING:  I have independently reviewed the scans and discussed with the patient.   I have reviewed Venita Lick LPN's note and agree with the documentation.  I personally performed a face-to-face visit, made revisions and my assessment  and plan is as follows.    ASSESSMENT & PLAN:   Prostate  cancer metastatic to bone ([place]) 1.  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  Prostate biopsy on 02/06/2015 showed adenocarcinoma, Gleason 4+5 is equal to 9. -Zytiga and prednisone started on 05/05/2016. -Last bone scan in July 2016 shows activity in the midshaft region of the left humerus, left calvarium, posterior right seventh rib, eighth rib, ninth rib on the right side. - He is continuing to tolerate Zytiga 500 mg 2 tablets daily along with prednisone 10 mg daily. - Last PSA was 8.38 on 11/24/2018. - His last Lupron injection was in the first week of March with Dr. Lerry Liner in [place]. - He wanted to continue injections with [place].  Hence I will put in Lupron injection at next visit. - We will call him with his PSA results from today.  He will be seen back in 2 months for follow-up.  2.  Bone metastasis: - We have discussed about denosumab in the past.  He was reluctant to consider it. -He is taking vitamin D 5 50,000 units weekly.  We will follow-up on the level from today.  3.  Atrial fibrillation: - He will continue amiodarone 200 mg daily.  He also takes Eliquis 5 mg twice daily. -Rate is well controlled on Toprol-XL 25 mg daily.  Time spent is 25 minutes with more than 50% of the time spent face-to-face discussing treatment plan and coordination of care.    Orders placed this encounter:  No orders of the defined types were placed in this encounter.     Derek Jack, MD [place] [phone]

## 2019-02-01 LAB — VITAMIN D 25 HYDROXY (VIT D DEFICIENCY, FRACTURES): Vit D, 25-Hydroxy: 47.3 ng/mL (ref 30.0–100.0)

## 2019-03-21 ENCOUNTER — Other Ambulatory Visit (HOSPITAL_COMMUNITY): Payer: Self-pay | Admitting: Hematology

## 2019-03-21 DIAGNOSIS — C61 Malignant neoplasm of prostate: Secondary | ICD-10-CM

## 2019-03-21 DIAGNOSIS — C7951 Secondary malignant neoplasm of bone: Secondary | ICD-10-CM

## 2019-03-25 ENCOUNTER — Other Ambulatory Visit (HOSPITAL_COMMUNITY): Payer: Self-pay | Admitting: *Deleted

## 2019-03-25 DIAGNOSIS — C61 Malignant neoplasm of prostate: Secondary | ICD-10-CM

## 2019-03-25 DIAGNOSIS — C7951 Secondary malignant neoplasm of bone: Secondary | ICD-10-CM

## 2019-03-28 ENCOUNTER — Inpatient Hospital Stay (HOSPITAL_COMMUNITY): Payer: Medicare Other

## 2019-03-28 ENCOUNTER — Encounter (HOSPITAL_COMMUNITY): Payer: Self-pay | Admitting: Hematology

## 2019-03-28 ENCOUNTER — Inpatient Hospital Stay (HOSPITAL_COMMUNITY): Payer: Medicare Other | Attending: Hematology | Admitting: Hematology

## 2019-03-28 ENCOUNTER — Other Ambulatory Visit: Payer: Self-pay

## 2019-03-28 VITALS — BP 124/57 | HR 54 | Temp 96.9°F | Resp 16 | Wt 247.3 lb

## 2019-03-28 DIAGNOSIS — Z87891 Personal history of nicotine dependence: Secondary | ICD-10-CM | POA: Diagnosis not present

## 2019-03-28 DIAGNOSIS — Z7901 Long term (current) use of anticoagulants: Secondary | ICD-10-CM | POA: Insufficient documentation

## 2019-03-28 DIAGNOSIS — I4891 Unspecified atrial fibrillation: Secondary | ICD-10-CM | POA: Diagnosis not present

## 2019-03-28 DIAGNOSIS — Z191 Hormone sensitive malignancy status: Secondary | ICD-10-CM | POA: Insufficient documentation

## 2019-03-28 DIAGNOSIS — Z79899 Other long term (current) drug therapy: Secondary | ICD-10-CM | POA: Diagnosis not present

## 2019-03-28 DIAGNOSIS — C61 Malignant neoplasm of prostate: Secondary | ICD-10-CM | POA: Insufficient documentation

## 2019-03-28 DIAGNOSIS — C7951 Secondary malignant neoplasm of bone: Secondary | ICD-10-CM

## 2019-03-28 LAB — CBC WITH DIFFERENTIAL/PLATELET
Abs Immature Granulocytes: 0.04 10*3/uL (ref 0.00–0.07)
Basophils Absolute: 0 10*3/uL (ref 0.0–0.1)
Basophils Relative: 1 %
Eosinophils Absolute: 0.1 10*3/uL (ref 0.0–0.5)
Eosinophils Relative: 1 %
HCT: 37.4 % — ABNORMAL LOW (ref 39.0–52.0)
Hemoglobin: 12.5 g/dL — ABNORMAL LOW (ref 13.0–17.0)
Immature Granulocytes: 1 %
Lymphocytes Relative: 10 %
Lymphs Abs: 0.9 10*3/uL (ref 0.7–4.0)
MCH: 30.7 pg (ref 26.0–34.0)
MCHC: 33.4 g/dL (ref 30.0–36.0)
MCV: 91.9 fL (ref 80.0–100.0)
Monocytes Absolute: 0.8 10*3/uL (ref 0.1–1.0)
Monocytes Relative: 10 %
Neutro Abs: 6.5 10*3/uL (ref 1.7–7.7)
Neutrophils Relative %: 77 %
Platelets: 206 10*3/uL (ref 150–400)
RBC: 4.07 MIL/uL — ABNORMAL LOW (ref 4.22–5.81)
RDW: 14.8 % (ref 11.5–15.5)
WBC: 8.3 10*3/uL (ref 4.0–10.5)
nRBC: 0 % (ref 0.0–0.2)

## 2019-03-28 LAB — COMPREHENSIVE METABOLIC PANEL
ALT: 17 U/L (ref 0–44)
AST: 21 U/L (ref 15–41)
Albumin: 4.2 g/dL (ref 3.5–5.0)
Alkaline Phosphatase: 87 U/L (ref 38–126)
Anion gap: 8 (ref 5–15)
BUN: 21 mg/dL (ref 8–23)
CO2: 24 mmol/L (ref 22–32)
Calcium: 9 mg/dL (ref 8.9–10.3)
Chloride: 107 mmol/L (ref 98–111)
Creatinine, Ser: 1.34 mg/dL — ABNORMAL HIGH (ref 0.61–1.24)
GFR calc Af Amer: 56 mL/min — ABNORMAL LOW (ref >60)
GFR calc non Af Amer: 49 mL/min — ABNORMAL LOW (ref >60)
Glucose, Bld: 88 mg/dL (ref 70–99)
Potassium: 4.5 mmol/L (ref 3.5–5.1)
Sodium: 139 mmol/L (ref 135–145)
Total Bilirubin: 1.3 mg/dL — ABNORMAL HIGH (ref 0.3–1.2)
Total Protein: 6.4 g/dL — ABNORMAL LOW (ref 6.5–8.1)

## 2019-03-28 LAB — PSA: Prostatic Specific Antigen: 11.4 ng/mL — ABNORMAL HIGH (ref 0.00–4.00)

## 2019-03-28 MED ORDER — LEUPROLIDE ACETATE (6 MONTH) 45 MG IM KIT
45.0000 mg | PACK | Freq: Once | INTRAMUSCULAR | Status: AC
  Filled 2019-03-28: qty 45

## 2019-03-28 NOTE — Progress Notes (Signed)
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  CURRENT THERAPY:Zytiga and prednisone     INTERVAL HISTORY:  [name] 85 y.o. male seen for follow-up of metastatic prostate cancer to the bones.  He is tolerating Zytiga 500 mg 2 tablets daily very well.  He is also taking prednisone 10 mg daily.  Denies any major hot flashes.  Appetite is 100%.  Energy levels are 75%.  Denies any fevers or infections.  Denies any nausea, vomiting, diarrhea or constipation.  No bleeding per rectum or melena.  Last Lupron injection was in the first week of March.    REVIEW OF SYSTEMS:  Review of Systems  All other systems reviewed and are negative.    PAST MEDICAL/SURGICAL HISTORY:  Past Medical History:  Diagnosis Date  . Prostate cancer [facility])    History reviewed. No pertinent surgical history.   SOCIAL HISTORY:  Social History   Socioeconomic History  . Marital status: Married    Spouse name: Not on file  . Number of children: Not on file  . Years of education: Not on file  . Highest education level: Not on file  Occupational History  . Not on file  Social Needs  . Financial resource strain: Not on file  . Food insecurity    Worry: Not on file    Inability: Not on file  . Transportation needs    Medical: Not on file    Non-medical: Not on file  Tobacco Use  . Smoking status: Former Smoker    Types: Cigarettes    Quit date: 11/10/1977    Years since quitting: 41.4  . Smokeless tobacco: Never Used  Substance and Sexual Activity  . Alcohol use: Not Currently  . Drug use: Never  . Sexual activity: Not Currently  Lifestyle  . Physical activity    Days per week: Not on file    Minutes per session: Not on file  . Stress: Not on file   Relationships  . Social Herbalist on phone: Not on file    Gets together: Not on file    Attends religious service: Not on file    Active member of club or organization: Not on file    Attends meetings of clubs or organizations: Not on file    Relationship status: Not on file  . Intimate partner violence    Fear of current or ex partner: Not on file    Emotionally abused: Not on file    Physically abused: Not on file    Forced sexual activity: Not on file  Other Topics Concern  . Not on file  Social History Narrative  . Not on file    FAMILY HISTORY:  History reviewed. No pertinent family history.  CURRENT MEDICATIONS:  Outpatient Encounter Medications as of 03/28/2019  Medication Sig  . amiodarone (PACERONE) 200 MG tablet Take 200 mg by mouth daily.   [name] ELIQUIS 5 MG TABS tablet Take 5 mg by mouth 2 (two) times daily.  . metoprolol succinate (TOPROL-XL) 25 MG 24 hr tablet Take 25 mg by mouth every morning.  . predniSONE (DELTASONE) 5 MG tablet Take 10 mg by mouth daily.  . TRAVATAN Z 0.004 % SOLN ophthalmic solution Place 1 drop into both eyes  at bedtime.   . Vitamin D, Ergocalciferol, (DRISDOL) 50000 units CAPS capsule Take 50,000 Units by mouth daily.   [name] ZYTIGA 500 MG tablet TAKE 2 TABLETS (1,000 MG TOTAL) BY MOUTH DAILY.   No facility-administered encounter medications on file as of 03/28/2019.     ALLERGIES:  No Known Allergies   PHYSICAL EXAM:  ECOG Performance status: 1  Vitals:   03/28/19 1025  BP: (!) 124/57  Pulse: (!) 54  Resp: 16  Temp: (!) 96.9 F (36.1 C)  SpO2: 99%   Filed Weights   03/28/19 1025  Weight: 247 lb 4.8 oz (112.2 kg)    Physical Exam Vitals signs reviewed.  Constitutional:      Appearance: Normal appearance.  Cardiovascular:     Rate and Rhythm: Normal rate and regular rhythm.     Heart sounds: Normal heart sounds.  Pulmonary:     Effort: Pulmonary effort is normal.     Breath sounds: Normal breath sounds.   Abdominal:     General: There is no distension.     Palpations: Abdomen is soft. There is no mass.  Musculoskeletal:        General: No swelling.  Skin:    General: Skin is warm.  Neurological:     General: No focal deficit present.     Mental Status: He is alert and oriented to person, place, and time.  Psychiatric:        Mood and Affect: Mood normal.        Behavior: Behavior normal.      LABORATORY DATA:  I have reviewed the labs as listed.  CBC    Component Value Date/Time   WBC 8.3 03/28/2019 1004   RBC 4.07 (L) 03/28/2019 1004   HGB 12.5 (L) 03/28/2019 1004   HCT 37.4 (L) 03/28/2019 1004   PLT 206 03/28/2019 1004   MCV 91.9 03/28/2019 1004   MCH 30.7 03/28/2019 1004   MCHC 33.4 03/28/2019 1004   RDW 14.8 03/28/2019 1004   LYMPHSABS 0.9 03/28/2019 1004   MONOABS 0.8 03/28/2019 1004   EOSABS 0.1 03/28/2019 1004   BASOSABS 0.0 03/28/2019 1004   CMP Latest Ref Rng & Units 03/28/2019 01/31/2019 11/24/2018  Glucose 70 - 99 mg/dL 88 94 83  BUN 8 - 23 mg/dL 21 18 18   Creatinine 0.61 - 1.24 mg/dL 1.34(H) 1.34(H) 1.32(H)  Sodium 135 - 145 mmol/L 139 138 138  Potassium 3.5 - 5.1 mmol/L 4.5 4.1 4.1  Chloride 98 - 111 mmol/L 107 105 106  CO2 22 - 32 mmol/L 24 23 25   Calcium 8.9 - 10.3 mg/dL 9.0 9.2 9.0  Total Protein 6.5 - 8.1 g/dL 6.4(L) 6.6 6.4(L)  Total Bilirubin 0.3 - 1.2 mg/dL 1.3(H) 1.4(H) 1.2  Alkaline Phos 38 - 126 U/L 87 85 73  AST 15 - 41 U/L 21 20 18   ALT 0 - 44 U/L 17 15 14        DIAGNOSTIC IMAGING:  I have independently reviewed the scans and discussed with the patient.   I have reviewed Venita Lick LPN's note and agree with the documentation.  I personally performed a face-to-face visit, made revisions and my assessment and plan is as follows.    ASSESSMENT & PLAN:   Prostate cancer metastatic to bone ([place]) 1.  Metastatic castration sensitive prostate cancer to bones: -Diagnosis in July 2016, presentation with urinary retention April 2016  with PSA of 797.  Prostate biopsy on 02/06/2015 showed adenocarcinoma, Gleason 4+5, 9. -Zytiga and  prednisone started on 05/05/2016. -Last bone scan in July 2016 shows activity in the midshaft region of the left humerus, left calvarium, posterior right seventh rib, eighth rib, ninth rib on the right side. -He is continuing to tolerate Zytiga 500 mg 2 tablets daily along with prednisone 10 mg daily. - Last Lupron injection was in the first week of March with Dr. Lerry Liner in [place]. -We will follow-up on the PSA today.  Last PSA went up slightly.  He was told to follow-up with Dr. Lerry Liner to get his next Lupron shot.  We will take over the Lupron injections from dose.  He will return back in 2 months for follow-up.  2.  Bone metastasis: - We have discussed about denosumab in the past.  He was reluctant to consider it. -He is taking vitamin D 50,000 units weekly.  His levels were in the normal range.  3.  Atrial fibrillation: - He will continue amiodarone 200 mg daily.  He is taking Eliquis 5 mg twice daily.  He is also on Toprol-XL 25 mg daily. - He complains of upper extremity bruising.  He will call his cardiologist to cut back on Eliquis to 2.5 mg twice daily.  Time spent is 25 minutes with more than 50% of the time spent face-to-face discussing treatment plan and coordination of care.    Orders placed this encounter:  Orders Placed This Encounter  Procedures  . CBC with Differential/Platelet  . Comprehensive metabolic panel  . PSA  . Vitamin D 25 hydroxy      Derek Jack, MD [place] [phone]

## 2019-03-28 NOTE — Patient Instructions (Addendum)
[facility] at [facility] Discharge Instructions  You were seen today by Dr. Katragadda. He went over your recent lab results. He will see you back in 2 months for labs and follow up.   Thank you for choosing [facility] at [facility] to provide your oncology and hematology care.  To afford each patient quality time with our provider, please arrive at least 15 minutes before your scheduled appointment time.   If you have a lab appointment with the Cancer Center please come in thru the  Main Entrance and check in at the main information desk  You need to re-schedule your appointment should you arrive 10 or more minutes late.  We strive to give you quality time with our providers, and arriving late affects you and other patients whose appointments are after yours.  Also, if you no show three or more times for appointments you may be dismissed from the clinic at the providers discretion.     Again, thank you for choosing [facility].  Our hope is that these requests will decrease the amount of time that you wait before being seen by our physicians.       _____________________________________________________________  Should you have questions after your visit to [facility], please contact our office at [phone] between the hours of 8:00 a.m. and 4:30 p.m.  Voicemails left after 4:00 p.m. will not be returned until the following business day.  For prescription refill requests, have your pharmacy contact our office and allow 72 hours.    Cancer Center Support Programs:   > Cancer Support Group  2nd Tuesday of the month 1pm-2pm, Journey Room    

## 2019-03-28 NOTE — Assessment & Plan Note (Addendum)
1.  Metastatic castration sensitive prostate cancer to bones: -Diagnosis in July 2016, presentation with urinary retention April 2016 with PSA of 797.  Prostate biopsy on 02/06/2015 showed adenocarcinoma, Gleason 4+5, 9. -Zytiga and prednisone started on 05/05/2016. -Last bone scan in July 2016 shows activity in the midshaft region of the left humerus, left calvarium, posterior right seventh rib, eighth rib, ninth rib on the right side. -He is continuing to tolerate Zytiga 500 mg 2 tablets daily along with prednisone 10 mg daily. - Last Lupron injection was in the first week of March with Dr. Lerry Liner in [place]. -We will follow-up on the PSA today.  Last PSA went up slightly.  He was told to follow-up with Dr. Lerry Liner to get his next Lupron shot.  We will take over the Lupron injections from dose.  He will return back in 2 months for follow-up.  2.  Bone metastasis: - We have discussed about denosumab in the past.  He was reluctant to consider it. -He is taking vitamin D 50,000 units weekly.  His levels were in the normal range.  3.  Atrial fibrillation: - He will continue amiodarone 200 mg daily.  He is taking Eliquis 5 mg twice daily.  He is also on Toprol-XL 25 mg daily. - He complains of upper extremity bruising.  He will call his cardiologist to cut back on Eliquis to 2.5 mg twice daily.

## 2019-03-29 LAB — VITAMIN D 25 HYDROXY (VIT D DEFICIENCY, FRACTURES): Vit D, 25-Hydroxy: 44.8 ng/mL (ref 30.0–100.0)

## 2019-04-10 ENCOUNTER — Other Ambulatory Visit (HOSPITAL_COMMUNITY): Payer: Self-pay | Admitting: Hematology

## 2019-05-30 ENCOUNTER — Inpatient Hospital Stay (HOSPITAL_COMMUNITY): Payer: Medicare Other | Attending: Hematology | Admitting: Hematology

## 2019-05-30 ENCOUNTER — Other Ambulatory Visit: Payer: Self-pay

## 2019-05-30 ENCOUNTER — Encounter (HOSPITAL_COMMUNITY): Payer: Self-pay | Admitting: Hematology

## 2019-05-30 ENCOUNTER — Inpatient Hospital Stay (HOSPITAL_COMMUNITY): Payer: Medicare Other

## 2019-05-30 ENCOUNTER — Other Ambulatory Visit (HOSPITAL_COMMUNITY): Payer: No Typology Code available for payment source

## 2019-05-30 VITALS — BP 133/67 | HR 54 | Temp 97.4°F | Resp 18 | Wt 249.4 lb

## 2019-05-30 DIAGNOSIS — I4891 Unspecified atrial fibrillation: Secondary | ICD-10-CM | POA: Insufficient documentation

## 2019-05-30 DIAGNOSIS — C7951 Secondary malignant neoplasm of bone: Secondary | ICD-10-CM

## 2019-05-30 DIAGNOSIS — Z87891 Personal history of nicotine dependence: Secondary | ICD-10-CM | POA: Insufficient documentation

## 2019-05-30 DIAGNOSIS — Z7901 Long term (current) use of anticoagulants: Secondary | ICD-10-CM | POA: Diagnosis not present

## 2019-05-30 DIAGNOSIS — Z79899 Other long term (current) drug therapy: Secondary | ICD-10-CM | POA: Diagnosis not present

## 2019-05-30 DIAGNOSIS — C61 Malignant neoplasm of prostate: Secondary | ICD-10-CM | POA: Insufficient documentation

## 2019-05-30 LAB — CBC WITH DIFFERENTIAL/PLATELET
Abs Immature Granulocytes: 0.06 10*3/uL (ref 0.00–0.07)
Basophils Absolute: 0 10*3/uL (ref 0.0–0.1)
Basophils Relative: 0 %
Eosinophils Absolute: 0.2 10*3/uL (ref 0.0–0.5)
Eosinophils Relative: 2 %
HCT: 38 % — ABNORMAL LOW (ref 39.0–52.0)
Hemoglobin: 12.9 g/dL — ABNORMAL LOW (ref 13.0–17.0)
Immature Granulocytes: 1 %
Lymphocytes Relative: 10 %
Lymphs Abs: 1 10*3/uL (ref 0.7–4.0)
MCH: 30.8 pg (ref 26.0–34.0)
MCHC: 33.9 g/dL (ref 30.0–36.0)
MCV: 90.7 fL (ref 80.0–100.0)
Monocytes Absolute: 0.9 10*3/uL (ref 0.1–1.0)
Monocytes Relative: 9 %
Neutro Abs: 7.4 10*3/uL (ref 1.7–7.7)
Neutrophils Relative %: 78 %
Platelets: 212 10*3/uL (ref 150–400)
RBC: 4.19 MIL/uL — ABNORMAL LOW (ref 4.22–5.81)
RDW: 14.3 % (ref 11.5–15.5)
WBC: 9.6 10*3/uL (ref 4.0–10.5)
nRBC: 0 % (ref 0.0–0.2)

## 2019-05-30 LAB — COMPREHENSIVE METABOLIC PANEL
ALT: 15 U/L (ref 0–44)
AST: 20 U/L (ref 15–41)
Albumin: 4.2 g/dL (ref 3.5–5.0)
Alkaline Phosphatase: 82 U/L (ref 38–126)
Anion gap: 10 (ref 5–15)
BUN: 18 mg/dL (ref 8–23)
CO2: 24 mmol/L (ref 22–32)
Calcium: 9.1 mg/dL (ref 8.9–10.3)
Chloride: 105 mmol/L (ref 98–111)
Creatinine, Ser: 1.21 mg/dL (ref 0.61–1.24)
GFR calc Af Amer: >60 mL/min (ref >60)
GFR calc non Af Amer: 55 mL/min — ABNORMAL LOW (ref >60)
Glucose, Bld: 88 mg/dL (ref 70–99)
Potassium: 4.1 mmol/L (ref 3.5–5.1)
Sodium: 139 mmol/L (ref 135–145)
Total Bilirubin: 1.1 mg/dL (ref 0.3–1.2)
Total Protein: 6.3 g/dL — ABNORMAL LOW (ref 6.5–8.1)

## 2019-05-30 LAB — PSA: Prostatic Specific Antigen: 13.66 ng/mL — ABNORMAL HIGH (ref 0.00–4.00)

## 2019-05-30 LAB — VITAMIN D 25 HYDROXY (VIT D DEFICIENCY, FRACTURES): Vit D, 25-Hydroxy: 34.7 ng/mL (ref 30–100)

## 2019-05-30 NOTE — Assessment & Plan Note (Signed)
1.  Metastatic castration sensitive prostate cancer to bones: -Diagnosis in July 2016, presentation with urinary retention April 2016 with PSA of 797.  Prostate biopsy on 02/06/2015 showed adenocarcinoma, Gleason 4+5, 9. -Zytiga and prednisone started on 05/05/2016. -Last bone scan in July 2016 shows activity in the midshaft region of the left humerus, left calvarium, posterior right seventh rib, eighth rib, ninth rib on the right side. -He is continuing to tolerate Zytiga 500 mg 2 tablets daily along with prednisone 10 mg daily. - Last Lupron injection was in the first week of September with Dr. Lerry Liner in [place]. -We will follow-up on the PSA today.  Last PSA went up slightly. We will take over the Lupron injections as Dr. Lerry Liner is retiring. If PSA continues to trend up, we will need to repeat bone scan. He may also benefit from [place].  -  He will return back in 2 months for follow-up.  2.  Bone metastasis: - We have discussed about denosumab in the past.  He was reluctant to consider it. -He is taking vitamin D 50,000 units weekly.  His levels were in the normal range.  3.  Atrial fibrillation: - He will continue amiodarone 200 mg daily.  He is taking Eliquis 5 mg twice daily.  He is also on Toprol-XL 25 mg daily. - He complains of upper extremity bruising.  He will call his cardiologist to cut back on Eliquis to 2.5 mg twice daily.

## 2019-05-30 NOTE — Patient Instructions (Addendum)
[facility] at [facility] Discharge Instructions  You were seen today by Dr. Katragadda. He went over your recent lab results. He will see you back in 2 months for labs and follow up.   Thank you for choosing [facility] at [facility] to provide your oncology and hematology care.  To afford each patient quality time with our provider, please arrive at least 15 minutes before your scheduled appointment time.   If you have a lab appointment with the Cancer Center please come in thru the  Main Entrance and check in at the main information desk  You need to re-schedule your appointment should you arrive 10 or more minutes late.  We strive to give you quality time with our providers, and arriving late affects you and other patients whose appointments are after yours.  Also, if you no show three or more times for appointments you may be dismissed from the clinic at the providers discretion.     Again, thank you for choosing [facility].  Our hope is that these requests will decrease the amount of time that you wait before being seen by our physicians.       _____________________________________________________________  Should you have questions after your visit to [facility], please contact our office at [phone] between the hours of 8:00 a.m. and 4:30 p.m.  Voicemails left after 4:00 p.m. will not be returned until the following business day.  For prescription refill requests, have your pharmacy contact our office and allow 72 hours.    Cancer Center Support Programs:   > Cancer Support Group  2nd Tuesday of the month 1pm-2pm, Journey Room    

## 2019-05-30 NOTE — Progress Notes (Signed)
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  CURRENT THERAPY:Zytiga and prednisone     INTERVAL HISTORY:  [name] 85 y.o. male seen for follow-up of metastatic prostate cancer to the bones.  He is tolerating Zytiga 1000 mg daily very well.  He is taking prednisone daily.  Denies any major nausea, vomiting, diarrhea or constipation.  No major hot flashes.  Appetite is 100%.  Energy levels are 75%.  No fevers or night sweats reported.    REVIEW OF SYSTEMS:  Review of Systems  All other systems reviewed and are negative.    PAST MEDICAL/SURGICAL HISTORY:  Past Medical History:  Diagnosis Date  . Prostate cancer [facility])    History reviewed. No pertinent surgical history.   SOCIAL HISTORY:  Social History   Socioeconomic History  . Marital status: Married    Spouse name: Not on file  . Number of children: Not on file  . Years of education: Not on file  . Highest education level: Not on file  Occupational History  . Not on file  Social Needs  . Financial resource strain: Not on file  . Food insecurity    Worry: Not on file    Inability: Not on file  . Transportation needs    Medical: Not on file    Non-medical: Not on file  Tobacco Use  . Smoking status: Former Smoker    Types: Cigarettes    Quit date: 11/10/1977    Years since quitting: 41.5  . Smokeless tobacco: Never Used  Substance and Sexual Activity  . Alcohol use: Not Currently  . Drug use: Never  . Sexual activity: Not Currently  Lifestyle  . Physical activity    Days per week: Not on file    Minutes per session: Not on file  . Stress: Not on file  Relationships  . Social Herbalist on phone: Not on file    Gets together: Not on file   Attends religious service: Not on file    Active member of club or organization: Not on file    Attends meetings of clubs or organizations: Not on file    Relationship status: Not on file  . Intimate partner violence    Fear of current or ex partner: Not on file    Emotionally abused: Not on file    Physically abused: Not on file    Forced sexual activity: Not on file  Other Topics Concern  . Not on file  Social History Narrative  . Not on file    FAMILY HISTORY:  History reviewed. No pertinent family history.  CURRENT MEDICATIONS:  Outpatient Encounter Medications as of 05/30/2019  Medication Sig  . amiodarone (PACERONE) 200 MG tablet Take 200 mg by mouth daily.   [name] ELIQUIS 5 MG TABS tablet Take 5 mg by mouth 2 (two) times daily.  . metoprolol succinate (TOPROL-XL) 25 MG 24 hr tablet Take 25 mg by mouth every morning.  . TRAVATAN Z 0.004 % SOLN ophthalmic solution Place 1 drop into both eyes at bedtime.   . Vitamin D, Ergocalciferol, (DRISDOL) 50000 units CAPS capsule Take 50,000 Units by mouth daily.   . predniSONE (DELTASONE) 5 MG tablet TAKE 2 TABLETS BY MOUTH EVERY DAY (Patient not taking:  Reported on 05/30/2019)  . ZYTIGA 500 MG tablet TAKE 2 TABLETS (1,000 MG TOTAL) BY MOUTH DAILY. (Patient not taking: Reported on 05/30/2019)   Facility-Administered Encounter Medications as of 05/30/2019  Medication  . Leuprolide Acetate (6 Month) (LUPRON) injection 45 mg    ALLERGIES:  No Known Allergies   PHYSICAL EXAM:  ECOG Performance status: 1  Vitals:   05/30/19 1133  BP: 133/67  Pulse: (!) 54  Resp: 18  Temp: (!) 97.4 F (36.3 C)  SpO2: 98%   Filed Weights   05/30/19 1133  Weight: 249 lb 6.4 oz (113.1 kg)    Physical Exam Vitals signs reviewed.  Constitutional:      Appearance: Normal appearance.  Cardiovascular:     Rate and Rhythm: Normal rate and regular rhythm.     Heart sounds: Normal heart sounds.  Pulmonary:     Effort: Pulmonary effort is normal.      Breath sounds: Normal breath sounds.  Abdominal:     General: There is no distension.     Palpations: Abdomen is soft. There is no mass.  Musculoskeletal:        General: No swelling.  Skin:    General: Skin is warm.  Neurological:     General: No focal deficit present.     Mental Status: He is alert and oriented to person, place, and time.  Psychiatric:        Mood and Affect: Mood normal.        Behavior: Behavior normal.      LABORATORY DATA:  I have reviewed the labs as listed.  CBC    Component Value Date/Time   WBC 9.6 05/30/2019 1100   RBC 4.19 (L) 05/30/2019 1100   HGB 12.9 (L) 05/30/2019 1100   HCT 38.0 (L) 05/30/2019 1100   PLT 212 05/30/2019 1100   MCV 90.7 05/30/2019 1100   MCH 30.8 05/30/2019 1100   MCHC 33.9 05/30/2019 1100   RDW 14.3 05/30/2019 1100   LYMPHSABS 1.0 05/30/2019 1100   MONOABS 0.9 05/30/2019 1100   EOSABS 0.2 05/30/2019 1100   BASOSABS 0.0 05/30/2019 1100   CMP Latest Ref Rng & Units 05/30/2019 03/28/2019 01/31/2019  Glucose 70 - 99 mg/dL 88 88 94  BUN 8 - 23 mg/dL 18 21 18   Creatinine 0.61 - 1.24 mg/dL 1.21 1.34(H) 1.34(H)  Sodium 135 - 145 mmol/L 139 139 138  Potassium 3.5 - 5.1 mmol/L 4.1 4.5 4.1  Chloride 98 - 111 mmol/L 105 107 105  CO2 22 - 32 mmol/L 24 24 23   Calcium 8.9 - 10.3 mg/dL 9.1 9.0 9.2  Total Protein 6.5 - 8.1 g/dL 6.3(L) 6.4(L) 6.6  Total Bilirubin 0.3 - 1.2 mg/dL 1.1 1.3(H) 1.4(H)  Alkaline Phos 38 - 126 U/L 82 87 85  AST 15 - 41 U/L 20 21 20   ALT 0 - 44 U/L 15 17 15        DIAGNOSTIC IMAGING:  I have independently reviewed the scans and discussed with the patient.   I have reviewed Venita Lick LPN's note and agree with the documentation.  I personally performed a face-to-face visit, made revisions and my assessment and plan is as follows.    ASSESSMENT & PLAN:   Prostate cancer metastatic to bone ([place]) 1.  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  Prostate biopsy on 02/06/2015 showed adenocarcinoma, Gleason 4+5, 9. -Zytiga and prednisone started on 05/05/2016. -  Last bone scan in July 2016 shows activity in the midshaft region of the left humerus, left calvarium, posterior right seventh rib, eighth rib, ninth rib on the right side. -He is continuing to tolerate Zytiga 500 mg 2 tablets daily along with prednisone 10 mg daily. - Last Lupron injection was in the first week of September with Dr. Lerry Liner in [place]. -We will follow-up on the PSA today.  Last PSA went up slightly. We will take over the Lupron injections as Dr. Lerry Liner is retiring. If PSA continues to trend up, we will need to repeat bone scan. He may also benefit from [place].  -  He will return back in 2 months for follow-up.  2.  Bone metastasis: - We have discussed about denosumab in the past.  He was reluctant to consider it. -He is taking vitamin D 50,000 units weekly.  His levels were in the normal range.  3.  Atrial fibrillation: - He will continue amiodarone 200 mg daily.  He is taking Eliquis 5 mg twice daily.  He is also on Toprol-XL 25 mg daily. - He complains of upper extremity bruising.  He will call his cardiologist to cut back on Eliquis to 2.5 mg twice daily.  Time spent is 25 minutes with more than 50% of the time spent face-to-face discussing treatment plan and coordination of care.    Orders placed this encounter:  Orders Placed This Encounter  Procedures  . CBC with Differential/Platelet  . Comprehensive metabolic panel  . PSA      Derek Jack, MD [place] [phone]

## 2019-07-26 ENCOUNTER — Encounter (HOSPITAL_COMMUNITY): Payer: Self-pay | Admitting: Hematology

## 2019-07-26 ENCOUNTER — Inpatient Hospital Stay (HOSPITAL_COMMUNITY): Payer: Medicare Other

## 2019-07-26 ENCOUNTER — Inpatient Hospital Stay (HOSPITAL_COMMUNITY): Payer: Medicare Other | Attending: Hematology | Admitting: Hematology

## 2019-07-26 ENCOUNTER — Other Ambulatory Visit: Payer: Self-pay

## 2019-07-26 DIAGNOSIS — C61 Malignant neoplasm of prostate: Secondary | ICD-10-CM

## 2019-07-26 DIAGNOSIS — Z87891 Personal history of nicotine dependence: Secondary | ICD-10-CM | POA: Insufficient documentation

## 2019-07-26 DIAGNOSIS — I480 Paroxysmal atrial fibrillation: Secondary | ICD-10-CM | POA: Insufficient documentation

## 2019-07-26 DIAGNOSIS — C7951 Secondary malignant neoplasm of bone: Secondary | ICD-10-CM | POA: Insufficient documentation

## 2019-07-26 DIAGNOSIS — Z7901 Long term (current) use of anticoagulants: Secondary | ICD-10-CM | POA: Diagnosis not present

## 2019-07-26 LAB — CBC WITH DIFFERENTIAL/PLATELET
Abs Immature Granulocytes: 0.05 10*3/uL (ref 0.00–0.07)
Basophils Absolute: 0 10*3/uL (ref 0.0–0.1)
Basophils Relative: 0 %
Eosinophils Absolute: 0.2 10*3/uL (ref 0.0–0.5)
Eosinophils Relative: 2 %
HCT: 38.1 % — ABNORMAL LOW (ref 39.0–52.0)
Hemoglobin: 12.6 g/dL — ABNORMAL LOW (ref 13.0–17.0)
Immature Granulocytes: 1 %
Lymphocytes Relative: 8 %
Lymphs Abs: 0.8 10*3/uL (ref 0.7–4.0)
MCH: 30.4 pg (ref 26.0–34.0)
MCHC: 33.1 g/dL (ref 30.0–36.0)
MCV: 91.8 fL (ref 80.0–100.0)
Monocytes Absolute: 0.8 10*3/uL (ref 0.1–1.0)
Monocytes Relative: 8 %
Neutro Abs: 7.4 10*3/uL (ref 1.7–7.7)
Neutrophils Relative %: 81 %
Platelets: 211 10*3/uL (ref 150–400)
RBC: 4.15 MIL/uL — ABNORMAL LOW (ref 4.22–5.81)
RDW: 14.4 % (ref 11.5–15.5)
WBC: 9.2 10*3/uL (ref 4.0–10.5)
nRBC: 0 % (ref 0.0–0.2)

## 2019-07-26 LAB — COMPREHENSIVE METABOLIC PANEL
ALT: 16 U/L (ref 0–44)
AST: 18 U/L (ref 15–41)
Albumin: 4.1 g/dL (ref 3.5–5.0)
Alkaline Phosphatase: 85 U/L (ref 38–126)
Anion gap: 10 (ref 5–15)
BUN: 19 mg/dL (ref 8–23)
CO2: 25 mmol/L (ref 22–32)
Calcium: 9.1 mg/dL (ref 8.9–10.3)
Chloride: 102 mmol/L (ref 98–111)
Creatinine, Ser: 1.33 mg/dL — ABNORMAL HIGH (ref 0.61–1.24)
GFR calc Af Amer: 57 mL/min — ABNORMAL LOW (ref >60)
GFR calc non Af Amer: 49 mL/min — ABNORMAL LOW (ref >60)
Glucose, Bld: 91 mg/dL (ref 70–99)
Potassium: 4.4 mmol/L (ref 3.5–5.1)
Sodium: 137 mmol/L (ref 135–145)
Total Bilirubin: 1.3 mg/dL — ABNORMAL HIGH (ref 0.3–1.2)
Total Protein: 6.3 g/dL — ABNORMAL LOW (ref 6.5–8.1)

## 2019-07-26 LAB — PSA: Prostatic Specific Antigen: 18.24 ng/mL — ABNORMAL HIGH (ref 0.00–4.00)

## 2019-07-26 NOTE — Patient Instructions (Signed)
[place] at [facility] Discharge Instructions  You were seen today by Dr. Delton Coombes. He went over your recent lab results. He will schedule you for your next Lupron injection in early March.   Thank you for choosing [place] at [facility] to provide your oncology and hematology care.  To afford each patient quality time with our provider, please arrive at least 15 minutes before your scheduled appointment time.   If you have a lab appointment with the [facility] please come in thru the  Main Entrance and check in at the main information desk  You need to re-schedule your appointment should you arrive 10 or more minutes late.  We strive to give you quality time with our providers, and arriving late affects you and other patients whose appointments are after yours.  Also, if you no show three or more times for appointments you may be dismissed from the clinic at the providers discretion.     Again, thank you for choosing [facility].  Our hope is that these requests will decrease the amount of time that you wait before being seen by our physicians.       _____________________________________________________________  Should you have questions after your visit to [facility], please contact our office at [phone] between the hours of 8:00 a.m. and 4:30 p.m.  Voicemails left after 4:00 p.m. will not be returned until the following business day.  For prescription refill requests, have your pharmacy contact our office and allow 72 hours.    Cancer Center Support Programs:   > Cancer Support Group  2nd Tuesday of the month 1pm-2pm, Journey Room

## 2019-07-26 NOTE — Assessment & Plan Note (Addendum)
1.  Metastatic castration sensitive prostate cancer to bones: -Diagnosis in July 2016, presentation with urinary retention April 2016 with PSA of 797.  Prostate biopsy on 02/06/2015 showed adenocarcinoma, Gleason 4+5, 9. -Zytiga and prednisone started on 05/05/2016. -Last bone scan in July 2016 shows activity in the midshaft region of the left humerus, left calvarium, posterior right seventh rib, eighth rib, ninth rib on the right side. -He is continuing to tolerate Zytiga 1000 mg daily along with prednisone 10 mg daily. -Last Lupron injection was in the first week of September with Dr. Lerry Liner in [place].  We plan to arrange next injection in the first week of March. -Denies any new onset pains.  I reviewed his labs which are grossly unchanged. -Last PSA on 05/30/2019 was 13.6.  Prior to that it was 11.4. -His PSA today increased to 18.4.  I would recommend a baseline bone scan.  Treatment option includes enzalutamide. -We will also do germline mutation testing.  I will also consider doing guardant 360.  2.  Bone metastasis: -He is continuing vitamin D 50,000 units weekly.  Levels were in the normal range.  3.  Atrial fibrillation: - He will continue amiodarone 200 mg daily.  He is taking Eliquis 5 mg twice daily.  He is also on Toprol-XL 25 mg daily.

## 2019-07-26 NOTE — Progress Notes (Signed)
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  CURRENT THERAPY:Zytiga and prednisone     INTERVAL HISTORY:  Ryan Walls 85 y.o. male seen for follow-up of metastatic prostate cancer to the bones.  He is tolerating Zytiga 1000 mg daily very well.  Also taking prednisone 10 mg daily.  Appetite and energy levels are 100%.  Denies any new onset pains.  Denies any fevers, night sweats or weight loss.  Denies any ER visits or hospitalization.    REVIEW OF SYSTEMS:  Review of Systems  All other systems reviewed and are negative.    PAST MEDICAL/SURGICAL HISTORY:  Past Medical History:  Diagnosis Date  . Prostate cancer [facility])    History reviewed. No pertinent surgical history.   SOCIAL HISTORY:  Social History   Socioeconomic History  . Marital status: Married    Spouse name: Not on file  . Number of children: Not on file  . Years of education: Not on file  . Highest education level: Not on file  Occupational History  . Not on file  Tobacco Use  . Smoking status: Former Smoker    Types: Cigarettes    Quit date: 11/10/1977    Years since quitting: 41.7  . Smokeless tobacco: Never Used  Substance and Sexual Activity  . Alcohol use: Not Currently  . Drug use: Never  . Sexual activity: Not Currently  Other Topics Concern  . Not on file  Social History Narrative  . Not on file   Social Determinants of Health   Financial Resource Strain:   . Difficulty of Paying Living Expenses: Not on file  Food Insecurity:   . Worried About Charity fundraiser in the Last Year: Not on file  . Ran Out of Food in the Last Year: Not on file  Transportation Needs:   . Lack of Transportation (Medical): Not on file  . Lack of Transportation  (Non-Medical): Not on file  Physical Activity:   . Days of Exercise per Week: Not on file  . Minutes of Exercise per Session: Not on file  Stress:   . Feeling of Stress : Not on file  Social Connections:   . Frequency of Communication with Friends and Family: Not on file  . Frequency of Social Gatherings with Friends and Family: Not on file  . Attends Religious Services: Not on file  . Active Member of Clubs or Organizations: Not on file  . Attends Archivist Meetings: Not on file  . Marital Status: Not on file  Intimate Partner Violence:   . Fear of Current or Ex-Partner: Not on file  . Emotionally Abused: Not on file  . Physically Abused: Not on file  . Sexually Abused: Not on file    FAMILY HISTORY:  History reviewed. No pertinent family history.  CURRENT MEDICATIONS:  Outpatient Encounter Medications as of 07/26/2019  Medication Sig  . amiodarone (PACERONE) 200 MG tablet Take 200 mg by mouth daily.   . bimatoprost (LUMIGAN) 0.01 % SOLN Place 1 drop into both eyes at bedtime.   [name] ELIQUIS 5 MG TABS tablet Take 5 mg by mouth 2 (two) times daily.  . metoprolol succinate (TOPROL-XL) 25 MG 24 hr tablet Take 25 mg by mouth every morning.  . predniSONE (DELTASONE)  5 MG tablet TAKE 2 TABLETS BY MOUTH EVERY DAY  . TRAVATAN Z 0.004 % SOLN ophthalmic solution Place 1 drop into both eyes at bedtime.   . Vitamin D, Ergocalciferol, (DRISDOL) 50000 units CAPS capsule Take 50,000 Units by mouth daily.   [name] ZYTIGA 500 MG tablet TAKE 2 TABLETS (1,000 MG TOTAL) BY MOUTH DAILY.   Facility-Administered Encounter Medications as of 07/26/2019  Medication  . Leuprolide Acetate (6 Month) (LUPRON) injection 45 mg    ALLERGIES:  No Known Allergies   PHYSICAL EXAM:  ECOG Performance status: 1  Vitals:   07/26/19 1126  BP: 135/71  Pulse: 71  Resp: 18  Temp: 97.6 F (36.4 C)  SpO2: 99%   Filed Weights   07/26/19 1126  Weight: 248 lb (112.5 kg)    Physical Exam Vitals  reviewed.  Constitutional:      Appearance: Normal appearance.  Cardiovascular:     Rate and Rhythm: Normal rate and regular rhythm.     Heart sounds: Normal heart sounds.  Pulmonary:     Effort: Pulmonary effort is normal.     Breath sounds: Normal breath sounds.  Abdominal:     General: There is no distension.     Palpations: Abdomen is soft. There is no mass.  Musculoskeletal:        General: No swelling.  Skin:    General: Skin is warm.  Neurological:     General: No focal deficit present.     Mental Status: He is alert and oriented to person, place, and time.  Psychiatric:        Mood and Affect: Mood normal.        Behavior: Behavior normal.      LABORATORY DATA:  I have reviewed the labs as listed.  CBC    Component Value Date/Time   WBC 9.2 07/26/2019 1048   RBC 4.15 (L) 07/26/2019 1048   HGB 12.6 (L) 07/26/2019 1048   HCT 38.1 (L) 07/26/2019 1048   PLT 211 07/26/2019 1048   MCV 91.8 07/26/2019 1048   MCH 30.4 07/26/2019 1048   MCHC 33.1 07/26/2019 1048   RDW 14.4 07/26/2019 1048   LYMPHSABS 0.8 07/26/2019 1048   MONOABS 0.8 07/26/2019 1048   EOSABS 0.2 07/26/2019 1048   BASOSABS 0.0 07/26/2019 1048   CMP Latest Ref Rng & Units 07/26/2019 05/30/2019 03/28/2019  Glucose 70 - 99 mg/dL 91 88 88  BUN 8 - 23 mg/dL 19 18 21   Creatinine 0.61 - 1.24 mg/dL 1.33(H) 1.21 1.34(H)  Sodium 135 - 145 mmol/L 137 139 139  Potassium 3.5 - 5.1 mmol/L 4.4 4.1 4.5  Chloride 98 - 111 mmol/L 102 105 107  CO2 22 - 32 mmol/L 25 24 24   Calcium 8.9 - 10.3 mg/dL 9.1 9.1 9.0  Total Protein 6.5 - 8.1 g/dL 6.3(L) 6.3(L) 6.4(L)  Total Bilirubin 0.3 - 1.2 mg/dL 1.3(H) 1.1 1.3(H)  Alkaline Phos 38 - 126 U/L 85 82 87  AST 15 - 41 U/L 18 20 21   ALT 0 - 44 U/L 16 15 17        DIAGNOSTIC IMAGING:  I have independently reviewed the scans and discussed with the patient.   I have reviewed Venita Lick LPN's note and agree with the documentation.  I personally performed a  face-to-face visit, made revisions and my assessment and plan is as follows.    ASSESSMENT & PLAN:   Prostate cancer metastatic to bone ([place]) 1.  Metastatic castration sensitive prostate cancer to  bones: -Diagnosis in July 2016, presentation with urinary retention April 2016 with PSA of 797.  Prostate biopsy on 02/06/2015 showed adenocarcinoma, Gleason 4+5, 9. -Zytiga and prednisone started on 05/05/2016. -Last bone scan in July 2016 shows activity in the midshaft region of the left humerus, left calvarium, posterior right seventh rib, eighth rib, ninth rib on the right side. -He is continuing to tolerate Zytiga 1000 mg daily along with prednisone 10 mg daily. -Last Lupron injection was in the first week of September with Dr. Lerry Liner in [place].  We plan to arrange next injection in the first week of March. -Denies any new onset pains.  I reviewed his labs which are grossly unchanged. -Last PSA on 05/30/2019 was 13.6.  Prior to that it was 11.4. -His PSA today increased to 18.4.  I would recommend a baseline bone scan.  Treatment option includes enzalutamide. -We will also do germline mutation testing.  I will also consider doing guardant 360.  2.  Bone metastasis: -He is continuing vitamin D 50,000 units weekly.  Levels were in the normal range.  3.  Atrial fibrillation: - He will continue amiodarone 200 mg daily.  He is taking Eliquis 5 mg twice daily.  He is also on Toprol-XL 25 mg daily.      Orders placed this encounter:  Orders Placed This Encounter  Procedures  . CBC with Differential/Platelet  . Comprehensive metabolic panel  . PSA      Derek Jack, MD [place] [phone]

## 2019-07-27 ENCOUNTER — Other Ambulatory Visit (HOSPITAL_COMMUNITY): Payer: Self-pay | Admitting: *Deleted

## 2019-07-27 DIAGNOSIS — C7951 Secondary malignant neoplasm of bone: Secondary | ICD-10-CM

## 2019-07-27 DIAGNOSIS — C61 Malignant neoplasm of prostate: Secondary | ICD-10-CM

## 2019-07-27 NOTE — Progress Notes (Signed)
Bone scan orders placed per Dr. Delton Coombes.

## 2019-07-28 ENCOUNTER — Other Ambulatory Visit (HOSPITAL_COMMUNITY): Payer: Self-pay | Admitting: *Deleted

## 2019-08-19 ENCOUNTER — Other Ambulatory Visit (HOSPITAL_COMMUNITY): Payer: No Typology Code available for payment source

## 2019-08-23 ENCOUNTER — Ambulatory Visit (HOSPITAL_COMMUNITY): Payer: No Typology Code available for payment source | Admitting: Hematology

## 2019-08-23 ENCOUNTER — Other Ambulatory Visit (HOSPITAL_COMMUNITY): Payer: No Typology Code available for payment source

## 2019-09-13 ENCOUNTER — Other Ambulatory Visit (HOSPITAL_COMMUNITY): Payer: Self-pay | Admitting: Hematology

## 2019-09-13 MED ORDER — PREDNISONE 5 MG PO TABS: 10.0000 mg | ORAL_TABLET | Freq: Two times a day (BID) | ORAL | 12 refills | Status: DC

## 2019-09-15 ENCOUNTER — Ambulatory Visit (HOSPITAL_COMMUNITY): Payer: No Typology Code available for payment source | Admitting: Genetic Counselor

## 2019-09-15 ENCOUNTER — Other Ambulatory Visit (HOSPITAL_COMMUNITY): Payer: No Typology Code available for payment source

## 2019-09-28 ENCOUNTER — Ambulatory Visit (HOSPITAL_COMMUNITY): Payer: No Typology Code available for payment source

## 2019-10-04 ENCOUNTER — Other Ambulatory Visit: Payer: Self-pay

## 2019-10-04 ENCOUNTER — Encounter (HOSPITAL_COMMUNITY): Payer: Self-pay

## 2019-10-04 ENCOUNTER — Encounter (HOSPITAL_COMMUNITY)
Admission: RE | Admit: 2019-10-04 | Discharge: 2019-10-04 | Disposition: A | Discharge status: Home / Self Care | Payer: Medicare Other | Source: Ambulatory Visit | Attending: Hematology | Admitting: Hematology

## 2019-10-04 DIAGNOSIS — C61 Malignant neoplasm of prostate: Secondary | ICD-10-CM | POA: Diagnosis not present

## 2019-10-04 DIAGNOSIS — C7951 Secondary malignant neoplasm of bone: Secondary | ICD-10-CM | POA: Insufficient documentation

## 2019-10-04 MED ORDER — TECHNETIUM TC 99M MEDRONATE IV KIT
20.0000 | PACK | Freq: Once | INTRAVENOUS | Status: AC | PRN
  Administered 2019-10-04: 21 via INTRAVENOUS

## 2019-10-06 ENCOUNTER — Other Ambulatory Visit (HOSPITAL_COMMUNITY): Payer: No Typology Code available for payment source

## 2019-10-06 ENCOUNTER — Ambulatory Visit (HOSPITAL_COMMUNITY): Payer: No Typology Code available for payment source

## 2019-10-11 ENCOUNTER — Inpatient Hospital Stay (HOSPITAL_COMMUNITY): Payer: Medicare Other | Attending: Hematology | Admitting: Hematology

## 2019-10-11 ENCOUNTER — Other Ambulatory Visit: Payer: Self-pay

## 2019-10-11 VITALS — BP 128/65 | HR 72 | Temp 96.8°F | Resp 18 | Wt 234.0 lb

## 2019-10-11 DIAGNOSIS — Z87891 Personal history of nicotine dependence: Secondary | ICD-10-CM | POA: Diagnosis not present

## 2019-10-11 DIAGNOSIS — Z7901 Long term (current) use of anticoagulants: Secondary | ICD-10-CM | POA: Diagnosis not present

## 2019-10-11 DIAGNOSIS — Z79899 Other long term (current) drug therapy: Secondary | ICD-10-CM | POA: Insufficient documentation

## 2019-10-11 DIAGNOSIS — Z8616 Personal history of COVID-19: Secondary | ICD-10-CM | POA: Insufficient documentation

## 2019-10-11 DIAGNOSIS — I4891 Unspecified atrial fibrillation: Secondary | ICD-10-CM | POA: Insufficient documentation

## 2019-10-11 DIAGNOSIS — C61 Malignant neoplasm of prostate: Secondary | ICD-10-CM | POA: Insufficient documentation

## 2019-10-11 DIAGNOSIS — C7951 Secondary malignant neoplasm of bone: Secondary | ICD-10-CM | POA: Diagnosis not present

## 2019-10-11 DIAGNOSIS — R9721 Rising PSA following treatment for malignant neoplasm of prostate: Secondary | ICD-10-CM | POA: Diagnosis not present

## 2019-10-11 MED ORDER — ENZALUTAMIDE 40 MG PO CAPS: 160.0000 mg | ORAL_CAPSULE | Freq: Every day | ORAL | 2 refills | Status: DC

## 2019-10-11 NOTE — Progress Notes (Signed)
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  CURRENT THERAPY:Zytiga and prednisone     INTERVAL HISTORY:  Ryan Walls 86 y.o. male seen for follow-up of metastatic prostate cancer to bones.  He is taking Zytiga and prednisone 10 mg daily.  He was reportedly admitted to [facility] in [place] with Covid infection.  He reportedly presented with confusion and did not have any lung problems.  He was there in the hospital for 7 days.  Denies any new onset bone pains.  He is tolerating Abiraterone very well.    REVIEW OF SYSTEMS:  Review of Systems  All other systems reviewed and are negative.    PAST MEDICAL/SURGICAL HISTORY:  Past Medical History:  Diagnosis Date  . Prostate cancer ([place])    No past surgical history on file.   SOCIAL HISTORY:  Social History   Socioeconomic History  . Marital status: Married    Spouse name: Not on file  . Number of children: Not on file  . Years of education: Not on file  . Highest education level: Not on file  Occupational History  . Not on file  Tobacco Use  . Smoking status: Former Smoker    Types: Cigarettes    Quit date: 11/10/1977    Years since quitting: 41.9  . Smokeless tobacco: Never Used  Substance and Sexual Activity  . Alcohol use: Not Currently  . Drug use: Never  . Sexual activity: Not Currently  Other Topics Concern  . Not on file  Social History Narrative  . Not on file   Social Determinants of Health   Financial Resource Strain:   . Difficulty of Paying Living Expenses: Not on file  Food Insecurity:   . Worried About Charity fundraiser in the Last Year: Not on file  . Ran Out of Food in the Last Year: Not on file  Transportation Needs:   . Lack  of Transportation (Medical): Not on file  . Lack of Transportation (Non-Medical): Not on file  Physical Activity:   . Days of Exercise per Week: Not on file  . Minutes of Exercise per Session: Not on file  Stress:   . Feeling of Stress : Not on file  Social Connections:   . Frequency of Communication with Friends and Family: Not on file  . Frequency of Social Gatherings with Friends and Family: Not on file  . Attends Religious Services: Not on file  . Active Member of Clubs or Organizations: Not on file  . Attends Archivist Meetings: Not on file  . Marital Status: Not on file  Intimate Partner Violence:   . Fear of Current or Ex-Partner: Not on file  . Emotionally Abused: Not on file  . Physically Abused: Not on file  . Sexually Abused: Not on file    FAMILY HISTORY:  No family history on file.  CURRENT MEDICATIONS:  Outpatient Encounter Medications as of 10/11/2019  Medication Sig  . amiodarone (PACERONE) 200 MG tablet Take 200 mg by mouth daily.   . bimatoprost (LUMIGAN) 0.01 % SOLN Place 1 drop into both eyes at bedtime.   [name] ELIQUIS 5 MG TABS tablet Take 5 mg by mouth 2 (two) times daily.  . metoprolol succinate (TOPROL-XL) 25 MG  24 hr tablet Take 25 mg by mouth every morning.  . predniSONE (DELTASONE) 5 MG tablet Take 2 tablets (10 mg total) by mouth 2 (two) times daily with a meal.  . TRAVATAN Z 0.004 % SOLN ophthalmic solution Place 1 drop into both eyes at bedtime.   . Vitamin D, Ergocalciferol, (DRISDOL) 50000 units CAPS capsule Take 50,000 Units by mouth daily.   [name] ZYTIGA 500 MG tablet TAKE 2 TABLETS (1,000 MG TOTAL) BY MOUTH DAILY.  [name] enzalutamide (XTANDI) 40 MG capsule Take 4 capsules (160 mg total) by mouth daily. May substitute 80mg  tablets if available. Take 80 mg for 1 week, and increase to 160 mg daily   Facility-Administered Encounter Medications as of 10/11/2019  Medication  . Leuprolide Acetate (6 Month) (LUPRON) injection 45 mg    ALLERGIES:  No  Known Allergies   PHYSICAL EXAM:  ECOG Performance status: 1  Vitals:   10/11/19 1148  BP: 128/65  Pulse: 72  Resp: 18  Temp: (!) 96.8 F (36 C)  SpO2: 99%   Filed Weights   10/11/19 1148  Weight: 234 lb (106.1 kg)    Physical Exam Vitals reviewed.  Constitutional:      Appearance: Normal appearance.  Cardiovascular:     Rate and Rhythm: Normal rate and regular rhythm.     Heart sounds: Normal heart sounds.  Pulmonary:     Effort: Pulmonary effort is normal.     Breath sounds: Normal breath sounds.  Abdominal:     General: There is no distension.     Palpations: Abdomen is soft. There is no mass.  Musculoskeletal:        General: No swelling.  Skin:    General: Skin is warm.  Neurological:     General: No focal deficit present.     Mental Status: He is alert and oriented to person, place, and time.  Psychiatric:        Mood and Affect: Mood normal.        Behavior: Behavior normal.      LABORATORY DATA:  I have reviewed the labs as listed.  CBC    Component Value Date/Time   WBC 9.2 07/26/2019 1048   RBC 4.15 (L) 07/26/2019 1048   HGB 12.6 (L) 07/26/2019 1048   HCT 38.1 (L) 07/26/2019 1048   PLT 211 07/26/2019 1048   MCV 91.8 07/26/2019 1048   MCH 30.4 07/26/2019 1048   MCHC 33.1 07/26/2019 1048   RDW 14.4 07/26/2019 1048   LYMPHSABS 0.8 07/26/2019 1048   MONOABS 0.8 07/26/2019 1048   EOSABS 0.2 07/26/2019 1048   BASOSABS 0.0 07/26/2019 1048   CMP Latest Ref Rng & Units 07/26/2019 05/30/2019 03/28/2019  Glucose 70 - 99 mg/dL 91 88 88  BUN 8 - 23 mg/dL 19 18 21   Creatinine 0.61 - 1.24 mg/dL 1.33(H) 1.21 1.34(H)  Sodium 135 - 145 mmol/L 137 139 139  Potassium 3.5 - 5.1 mmol/L 4.4 4.1 4.5  Chloride 98 - 111 mmol/L 102 105 107  CO2 22 - 32 mmol/L 25 24 24   Calcium 8.9 - 10.3 mg/dL 9.1 9.1 9.0  Total Protein 6.5 - 8.1 g/dL 6.3(L) 6.3(L) 6.4(L)  Total Bilirubin 0.3 - 1.2 mg/dL 1.3(H) 1.1 1.3(H)  Alkaline Phos 38 - 126 U/L 85 82 87  AST 15 - 41  U/L 18 20 21   ALT 0 - 44 U/L 16 15 17        DIAGNOSTIC IMAGING:  I have reviewed scans independently.  I have reviewed Venita Lick LPN's note and agree with the documentation.  I personally performed a face-to-face visit, made revisions and my assessment and plan is as follows.    ASSESSMENT & PLAN:   Prostate cancer metastatic to bone ([place]) 1.  Metastatic prostate cancer to the bones: -Diagnosis in July 2016, presentation with urinary retention April 2016 with PSA of 797.  Prostate biopsy on 02/06/2015 consistent with adenocarcinoma, Gleason 4+5, 9. -Zytiga and prednisone from 05/05/2016 through 10/11/2019 with progression. -His last PSA was 18.2 on 07/06/2019. -He had Covid infection requiring hospitalization at [facility] in [place]. -We reviewed from 10/04/2019 which did not show any active lesions. -As his PSA was steadily going up, I have recommended discontinuing Abiraterone at this time.  He will slowly taper off prednisone by taking 5 mg daily for 1 month followed by 2.5 mg daily for 1 month. -We talked about starting him on enzalutamide.  We discussed side effects in detail. -He will start enzalutamide 80 mg daily for 1 week and increase it to 160 mg daily. -Today we will send a baseline PSA as well as guardant 360 for germline mutation testing which will open more options for treatment. -We will also consider sending foundation 1 results from his prostate biopsy. -His Lupron injection was in the first week of September with Dr. Lerry Liner in [place].  We will likely arrange his Lupron at next visit.  2.  Bone metastasis: -She will continue vitamin D 50,000 units weekly.  Levels are within the normal range.  3.  Atrial fibrillation: -He will continue amiodarone 200 mg daily.  He is taking Eliquis 5 mg twice daily.  He is on Toprol-XL 25 mg daily.     Orders placed this encounter:  No orders of the defined types were placed in this  encounter.     Derek Jack, MD [place] [phone]

## 2019-10-11 NOTE — Patient Instructions (Addendum)
[place] at [facility] Discharge Instructions  You were seen today by Dr. Delton Coombes. He went over your recent lab results. He will see you back in 4 weeks for labs and follow up.  Stop Zytiga and start Xtandi.  Dr. Delton Coombes wants you to taper off of the Prednisone.  He wants you to take one tablet every day for one month, then decrease to 1/2 tablet everyday for a month, then stop.  Thank you for choosing [place] at [facility] to provide your oncology and hematology care.  To afford each patient quality time with our provider, please arrive at least 15 minutes before your scheduled appointment time.   If you have a lab appointment with the [facility] please come in thru the  Main Entrance and check in at the main information desk  You need to re-schedule your appointment should you arrive 10 or more minutes late.  We strive to give you quality time with our providers, and arriving late affects you and other patients whose appointments are after yours.  Also, if you no show three or more times for appointments you may be dismissed from the clinic at the providers discretion.     Again, thank you for choosing [facility].  Our hope is that these requests will decrease the amount of time that you wait before being seen by our physicians.       _____________________________________________________________  Should you have questions after your visit to [facility], please contact our office at [phone] between the hours of 8:00 a.m. and 4:30 p.m.  Voicemails left after 4:00 p.m. will not be returned until the following business day.  For prescription refill requests, have your pharmacy contact our office and allow 72 hours.    Cancer Center Support Programs:   > Cancer Support Group  2nd Tuesday of the month 1pm-2pm, Journey Room

## 2019-10-11 NOTE — Progress Notes (Signed)
Patient's labs were drawn from right hand.  Labs sent for Guardant 360 CDx testing per Dr. Delton Coombes.  Patient understands that these results will not be back for 10-14 business days.

## 2019-10-11 NOTE — Assessment & Plan Note (Signed)
1.  Metastatic prostate cancer to the bones: -Diagnosis in July 2016, presentation with urinary retention April 2016 with PSA of 797.  Prostate biopsy on 02/06/2015 consistent with adenocarcinoma, Gleason 4+5, 9. -Zytiga and prednisone from 05/05/2016 through 10/11/2019 with progression. -His last PSA was 18.2 on 07/06/2019. -He had Covid infection requiring hospitalization at [facility] in [place]. -We reviewed from 10/04/2019 which did not show any active lesions. -As his PSA was steadily going up, I have recommended discontinuing Abiraterone at this time.  He will slowly taper off prednisone by taking 5 mg daily for 1 month followed by 2.5 mg daily for 1 month. -We talked about starting him on enzalutamide.  We discussed side effects in detail. -He will start enzalutamide 80 mg daily for 1 week and increase it to 160 mg daily. -Today we will send a baseline PSA as well as guardant 360 for germline mutation testing which will open more options for treatment. -We will also consider sending foundation 1 results from his prostate biopsy. -His Lupron injection was in the first week of September with Dr. Lerry Liner in [place].  We will likely arrange his Lupron at next visit.  2.  Bone metastasis: -She will continue vitamin D 50,000 units weekly.  Levels are within the normal range.  3.  Atrial fibrillation: -He will continue amiodarone 200 mg daily.  He is taking Eliquis 5 mg twice daily.  He is on Toprol-XL 25 mg daily.

## 2019-10-18 ENCOUNTER — Telehealth (HOSPITAL_COMMUNITY): Payer: Self-pay | Admitting: Pharmacist

## 2019-10-18 ENCOUNTER — Telehealth (HOSPITAL_COMMUNITY): Payer: Self-pay | Admitting: Pharmacy Technician

## 2019-10-18 NOTE — Telephone Encounter (Signed)
Oral Oncology Patient Advocate Encounter  Received notification from [facility] that prior authorization for Gillermina Phy is required.  PA submitted on CoverMyMeds Key [id number]  Status is pending  Oral Oncology Clinic will continue to follow.  [place] Patient [place] Phone [phone] Fax [phone] 10/18/2019 11:19 AM

## 2019-10-18 NOTE — Telephone Encounter (Signed)
Oral Oncology Pharmacist Encounter  Received new prescription for Xtandi (enzalutamide) for the treatment of metastatic castrate resistant prostate cancer in conjunction with Lupron, planned duration until disease progression or unacceptable drug toxicity.  Prescription dose and frequency assessed. MD dose escalation plan "80 mg daily for 1 week and increase it to 160 mg daily"  Current medication list in Epic reviewed, two DDIs with amiodarone identified: -Amiodarone: Xtandi may decrease the concentration of amiodarone, category D interaction. Xtandi may decrease the effectiveness of amiodarone -Eliquis:   Prescription has been e-scribed to the [facility] for benefits analysis and approval.  Oral Oncology Clinic will continue to follow for insurance authorization, copayment issues, initial counseling and start date.  Darl Pikes, PharmD, BCPS, BCOP, CPP Hematology/Oncology Clinical Pharmacist [facility]/HP/AP [facility] [place] [facility] [phone]  10/18/2019 1:41 PM

## 2019-10-18 NOTE — Telephone Encounter (Signed)
Oral Oncology Patient Advocate Encounter  Prior Authorization for Ryan Walls has been approved.    PA# [id number]  Effective dates: 07/20/2019 through 10/17/2020  Patients co-pay is $757.69.  Oral Oncology Clinic will continue to follow.   [place] Patient [name] Phone [phone] Fax [phone] 10/18/2019 11:22 AM

## 2019-11-08 ENCOUNTER — Ambulatory Visit (HOSPITAL_COMMUNITY): Payer: No Typology Code available for payment source | Admitting: Hematology

## 2019-11-16 ENCOUNTER — Inpatient Hospital Stay (HOSPITAL_COMMUNITY): Payer: Medicare Other

## 2019-11-16 ENCOUNTER — Inpatient Hospital Stay (HOSPITAL_COMMUNITY): Payer: Medicare Other | Attending: Hematology | Admitting: Hematology

## 2019-11-16 ENCOUNTER — Other Ambulatory Visit: Payer: Self-pay

## 2019-11-16 ENCOUNTER — Encounter (HOSPITAL_COMMUNITY): Payer: Self-pay | Admitting: Hematology

## 2019-11-16 DIAGNOSIS — C7951 Secondary malignant neoplasm of bone: Secondary | ICD-10-CM

## 2019-11-16 DIAGNOSIS — Z79818 Long term (current) use of other agents affecting estrogen receptors and estrogen levels: Secondary | ICD-10-CM | POA: Insufficient documentation

## 2019-11-16 DIAGNOSIS — I4891 Unspecified atrial fibrillation: Secondary | ICD-10-CM | POA: Diagnosis not present

## 2019-11-16 DIAGNOSIS — R339 Retention of urine, unspecified: Secondary | ICD-10-CM | POA: Diagnosis not present

## 2019-11-16 DIAGNOSIS — Z87891 Personal history of nicotine dependence: Secondary | ICD-10-CM | POA: Diagnosis not present

## 2019-11-16 DIAGNOSIS — C61 Malignant neoplasm of prostate: Secondary | ICD-10-CM

## 2019-11-16 DIAGNOSIS — Z7952 Long term (current) use of systemic steroids: Secondary | ICD-10-CM | POA: Diagnosis not present

## 2019-11-16 LAB — COMPREHENSIVE METABOLIC PANEL
ALT: 15 U/L (ref 0–44)
AST: 19 U/L (ref 15–41)
Albumin: 4.1 g/dL (ref 3.5–5.0)
Alkaline Phosphatase: 62 U/L (ref 38–126)
Anion gap: 8 (ref 5–15)
BUN: 19 mg/dL (ref 8–23)
CO2: 25 mmol/L (ref 22–32)
Calcium: 9 mg/dL (ref 8.9–10.3)
Chloride: 105 mmol/L (ref 98–111)
Creatinine, Ser: 1.2 mg/dL (ref 0.61–1.24)
GFR calc Af Amer: >60 mL/min (ref >60)
GFR calc non Af Amer: 56 mL/min — ABNORMAL LOW (ref >60)
Glucose, Bld: 106 mg/dL — ABNORMAL HIGH (ref 70–99)
Potassium: 3.8 mmol/L (ref 3.5–5.1)
Sodium: 138 mmol/L (ref 135–145)
Total Bilirubin: 1.1 mg/dL (ref 0.3–1.2)
Total Protein: 6 g/dL — ABNORMAL LOW (ref 6.5–8.1)

## 2019-11-16 LAB — CBC WITH DIFFERENTIAL/PLATELET
Abs Immature Granulocytes: 0.03 10*3/uL (ref 0.00–0.07)
Basophils Absolute: 0.1 10*3/uL (ref 0.0–0.1)
Basophils Relative: 1 %
Eosinophils Absolute: 0.4 10*3/uL (ref 0.0–0.5)
Eosinophils Relative: 5 %
HCT: 36.9 % — ABNORMAL LOW (ref 39.0–52.0)
Hemoglobin: 12.3 g/dL — ABNORMAL LOW (ref 13.0–17.0)
Immature Granulocytes: 0 %
Lymphocytes Relative: 18 %
Lymphs Abs: 1.2 10*3/uL (ref 0.7–4.0)
MCH: 29.6 pg (ref 26.0–34.0)
MCHC: 33.3 g/dL (ref 30.0–36.0)
MCV: 88.7 fL (ref 80.0–100.0)
Monocytes Absolute: 0.8 10*3/uL (ref 0.1–1.0)
Monocytes Relative: 12 %
Neutro Abs: 4.2 10*3/uL (ref 1.7–7.7)
Neutrophils Relative %: 64 %
Platelets: 230 10*3/uL (ref 150–400)
RBC: 4.16 MIL/uL — ABNORMAL LOW (ref 4.22–5.81)
RDW: 14.3 % (ref 11.5–15.5)
WBC: 6.7 10*3/uL (ref 4.0–10.5)
nRBC: 0 % (ref 0.0–0.2)

## 2019-11-16 LAB — PSA: Prostatic Specific Antigen: 5.96 ng/mL — ABNORMAL HIGH (ref 0.00–4.00)

## 2019-11-16 MED ORDER — LEUPROLIDE ACETATE (6 MONTH) 45 MG ~~LOC~~ KIT
45.0000 mg | PACK | Freq: Once | SUBCUTANEOUS | Status: AC
  Administered 2019-11-16: 45 mg via SUBCUTANEOUS
  Filled 2019-11-16: qty 45

## 2019-11-16 NOTE — Patient Instructions (Signed)
[place] at [facility] Discharge Instructions  You were seen today by Dr. Delton Coombes. He went over your recent lab results. Make sure that you are taking 160mg  daily of Xtandi. Continue taking Lupron every 6 months. He will see you back in 1 month for labs and follow up.   Thank you for choosing [place] at [facility] to provide your oncology and hematology care.  To afford each patient quality time with our provider, please arrive at least 15 minutes before your scheduled appointment time.   If you have a lab appointment with the [place] please come in thru the  Main Entrance and check in at the main information desk  You need to re-schedule your appointment should you arrive 10 or more minutes late.  We strive to give you quality time with our providers, and arriving late affects you and other patients whose appointments are after yours.  Also, if you no show three or more times for appointments you may be dismissed from the clinic at the providers discretion.     Again, thank you for choosing [facility].  Our hope is that these requests will decrease the amount of time that you wait before being seen by our physicians.       _____________________________________________________________  Should you have questions after your visit to [facility], please contact our office at [phone] between the hours of 8:00 a.m. and 4:30 p.m.  Voicemails left after 4:00 p.m. will not be returned until the following business day.  For prescription refill requests, have your pharmacy contact our office and allow 72 hours.    Cancer Center Support Programs:   > Cancer Support Group  2nd Tuesday of the month 1pm-2pm, Journey Room

## 2019-11-16 NOTE — Progress Notes (Signed)
Patient tolerated injection with no complaints voiced.  Site clean and dry with no bruising or swelling noted at site.  Band aid applied.  Vss with discharge and left ambulatory with no s/s of distress noted.  

## 2019-11-16 NOTE — Progress Notes (Signed)
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  CURRENT THERAPY:Enzalutamide.     INTERVAL HISTORY:  Ryan Walls 86 y.o. male seen for follow-up of metastatic prostate cancer to the bones.  He started taking enzalutamide on 11/03/2019.  He started out with 1 tablet daily for 1 week and increased it to 2 tablets daily for the last week.  Did not have any problems including nausea vomiting or diarrhea.  Appetite is 100%.  Energy levels are 75%.  Is accompanied by his wife today.  No new onset bone pains.   REVIEW OF SYSTEMS:  Review of Systems  All other systems reviewed and are negative.    PAST MEDICAL/SURGICAL HISTORY:  Past Medical History:  Diagnosis Date  . Prostate cancer Ryan Walls)    History reviewed. No pertinent surgical history.   SOCIAL HISTORY:  Social History   Socioeconomic History  . Marital status: Married    Spouse name: Not on file  . Number of children: Not on file  . Years of education: Not on file  . Highest education level: Not on file  Occupational History  . Not on file  Tobacco Use  . Smoking status: Former Smoker    Types: Cigarettes    Quit date: 11/10/1977    Years since quitting: 42.0  . Smokeless tobacco: Never Used  Substance and Sexual Activity  . Alcohol use: Not Currently  . Drug use: Never  . Sexual activity: Not Currently  Other Topics Concern  . Not on file  Social History Narrative  . Not on file   Social Determinants of Health   Financial Resource Strain:   . Difficulty of Paying Living Expenses:   Food Insecurity:   . Worried About Charity fundraiser in the Last Year:   . Arboriculturist in the Last Year:   Transportation Needs:   . Film/video editor (Medical):   [name] Lack of  Transportation (Non-Medical):   Physical Activity:   . Days of Exercise per Week:   . Minutes of Exercise per Session:   Stress:   . Feeling of Stress :   Social Connections:   . Frequency of Communication with Friends and Family:   . Frequency of Social Gatherings with Friends and Family:   . Attends Religious Services:   . Active Member of Clubs or Organizations:   . Attends Archivist Meetings:   [name] Marital Status:   Intimate Partner Violence:   . Fear of Current or Ex-Partner:   . Emotionally Abused:   [name] Physically Abused:   . Sexually Abused:     FAMILY HISTORY:  History reviewed. No pertinent family history.  CURRENT MEDICATIONS:  Outpatient Encounter Medications as of 11/16/2019  Medication Sig  . amiodarone (PACERONE) 200 MG tablet Take 200 mg by mouth daily.   . bimatoprost (LUMIGAN) 0.01 % SOLN Place 1 drop into both eyes at bedtime.   [name] ELIQUIS 5 MG TABS tablet Take 5 mg by mouth 2 (two) times daily.  . enzalutamide (XTANDI) 40 MG capsule Take 4 capsules (160 mg total) by mouth daily. May substitute 80mg  tablets if available. Take 80 mg for 1 week, and increase to 160 mg daily  . metoprolol succinate (TOPROL-XL) 25 MG 24 hr tablet Take 25  mg by mouth every morning.  . predniSONE (DELTASONE) 5 MG tablet Take 2 tablets (10 mg total) by mouth 2 (two) times daily with a meal.  . TRAVATAN Z 0.004 % SOLN ophthalmic solution Place 1 drop into both eyes at bedtime.   . Vitamin D, Ergocalciferol, (DRISDOL) 50000 units CAPS capsule Take 50,000 Units by mouth daily.   . [DISCONTINUED] XTANDI 80 MG tablet   . [DISCONTINUED] ZYTIGA 500 MG tablet TAKE 2 TABLETS (1,000 MG TOTAL) BY MOUTH DAILY. (Patient not taking: Reported on 11/16/2019)   Facility-Administered Encounter Medications as of 11/16/2019  Medication  . Leuprolide Acetate (6 Month) (LUPRON) injection 45 mg    ALLERGIES:  No Known Allergies   PHYSICAL EXAM:  ECOG Performance status: 1  Vitals:    11/16/19 1119  BP: 133/68  Pulse: 78  Resp: 18  Temp: (!) 96 F (35.6 C)  SpO2: 100%   Filed Weights   11/16/19 1119  Weight: 238 lb 6.4 oz (108.1 kg)    Physical Exam Vitals reviewed.  Constitutional:      Appearance: Normal appearance.  Cardiovascular:     Rate and Rhythm: Normal rate and regular rhythm.     Heart sounds: Normal heart sounds.  Pulmonary:     Effort: Pulmonary effort is normal.     Breath sounds: Normal breath sounds.  Abdominal:     General: There is no distension.     Palpations: Abdomen is soft. There is no mass.  Musculoskeletal:        General: No swelling.  Skin:    General: Skin is warm.  Neurological:     General: No focal deficit present.     Mental Status: He is alert and oriented to person, place, and time.  Psychiatric:        Mood and Affect: Mood normal.        Behavior: Behavior normal.      LABORATORY DATA:  I have reviewed the labs as listed.  CBC    Component Value Date/Time   WBC 6.7 11/16/2019 0951   RBC 4.16 (L) 11/16/2019 0951   HGB 12.3 (L) 11/16/2019 0951   HCT 36.9 (L) 11/16/2019 0951   PLT 230 11/16/2019 0951   MCV 88.7 11/16/2019 0951   MCH 29.6 11/16/2019 0951   MCHC 33.3 11/16/2019 0951   RDW 14.3 11/16/2019 0951   LYMPHSABS 1.2 11/16/2019 0951   MONOABS 0.8 11/16/2019 0951   EOSABS 0.4 11/16/2019 0951   BASOSABS 0.1 11/16/2019 0951   CMP Latest Ref Rng & Units 11/16/2019 07/26/2019 05/30/2019  Glucose 70 - 99 mg/dL 106(H) 91 88  BUN 8 - 23 mg/dL 19 19 18   Creatinine 0.61 - 1.24 mg/dL 1.20 1.33(H) 1.21  Sodium 135 - 145 mmol/L 138 137 139  Potassium 3.5 - 5.1 mmol/L 3.8 4.4 4.1  Chloride 98 - 111 mmol/L 105 102 105  CO2 22 - 32 mmol/L 25 25 24   Calcium 8.9 - 10.3 mg/dL 9.0 9.1 9.1  Total Protein 6.5 - 8.1 g/dL 6.0(L) 6.3(L) 6.3(L)  Total Bilirubin 0.3 - 1.2 mg/dL 1.1 1.3(H) 1.1  Alkaline Phos 38 - 126 U/L 62 85 82  AST 15 - 41 U/L 19 18 20   ALT 0 - 44 U/L 15 16 15        DIAGNOSTIC IMAGING:  I  have reviewed scans independently.   I have reviewed Venita Lick LPN's note and agree with the documentation.  I personally performed a face-to-face visit, made revisions and  my assessment and plan is as follows.    ASSESSMENT & PLAN:   Prostate cancer metastatic to bone ([place]) 1.  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  Prostate biopsy on 02/06/2015 consistent with adenocarcinoma, Gleason 4+5, 9. -Zytiga and prednisone from 05/05/2016 through 10/11/2019 with progression. -Last PSA on 07/06/2019 was 18.2. -He did not receive Lupron injection which was due in January of this year. -We switched him to enzalutamide on 11/03/2019.  He started taking 1 tablet daily for 1 week and increase to 2 tablets daily for the last week. -So far he has tolerated it very well.  I reviewed his labs.  PSA is pending.  LFTs are normal. -He will receive Lupron 45 mg today in our office.  I reviewed bone scan from 10/04/2019 which did not reveal any metastasis. 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 -I have recommended him to increase the enzalutamide to 160 mg daily.  I plan to see him back in 4 weeks with repeat labs.  2.  Bone metastasis: -He will continue vitamin D 50,000 units weekly.  3.  Atrial fibrillation: -He will continue amiodarone 200 mg daily.  He is taking Eliquis 5 mg twice daily.  He is on Toprol-XL 25 mg daily.     Orders placed this encounter:  No orders of the defined types were placed in this encounter.     Derek Jack, MD [place] [phone]

## 2019-11-16 NOTE — Assessment & Plan Note (Addendum)
1.  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  Prostate biopsy on 02/06/2015 consistent with adenocarcinoma, Gleason 4+5, 9. -Zytiga and prednisone from 05/05/2016 through 10/11/2019 with progression. -Last PSA on 07/06/2019 was 18.2. -He did not receive Lupron injection which was due in January of this year. -We switched him to enzalutamide on 11/03/2019.  He started taking 1 tablet daily for 1 week and increase to 2 tablets daily for the last week. -So far he has tolerated it very well.  I reviewed his labs.  PSA is pending.  LFTs are normal. -He will receive Lupron 45 mg today in our office.  I reviewed bone scan from 10/04/2019 which did not reveal any metastasis. 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 -I have recommended him to increase the enzalutamide to 160 mg daily.  I plan to see him back in 4 weeks with repeat labs.  2.  Bone metastasis: -He will continue vitamin D 50,000 units weekly.  3.  Atrial fibrillation: -He will continue amiodarone 200 mg daily.  He is taking Eliquis 5 mg twice daily.  He is on Toprol-XL 25 mg daily.

## 2019-12-15 ENCOUNTER — Inpatient Hospital Stay (HOSPITAL_COMMUNITY): Payer: Medicare Other | Attending: Hematology

## 2019-12-15 ENCOUNTER — Inpatient Hospital Stay (HOSPITAL_BASED_OUTPATIENT_CLINIC_OR_DEPARTMENT_OTHER): Payer: Medicare Other | Admitting: Hematology

## 2019-12-15 ENCOUNTER — Encounter (HOSPITAL_COMMUNITY): Payer: Self-pay | Admitting: Hematology

## 2019-12-15 ENCOUNTER — Other Ambulatory Visit: Payer: Self-pay

## 2019-12-15 DIAGNOSIS — C7951 Secondary malignant neoplasm of bone: Secondary | ICD-10-CM

## 2019-12-15 DIAGNOSIS — C61 Malignant neoplasm of prostate: Secondary | ICD-10-CM | POA: Diagnosis present

## 2019-12-15 DIAGNOSIS — Z87891 Personal history of nicotine dependence: Secondary | ICD-10-CM | POA: Diagnosis not present

## 2019-12-15 DIAGNOSIS — I4891 Unspecified atrial fibrillation: Secondary | ICD-10-CM | POA: Diagnosis not present

## 2019-12-15 LAB — CBC WITH DIFFERENTIAL/PLATELET
Abs Immature Granulocytes: 0.05 10*3/uL (ref 0.00–0.07)
Basophils Absolute: 0.1 10*3/uL (ref 0.0–0.1)
Basophils Relative: 1 %
Eosinophils Absolute: 0.5 10*3/uL (ref 0.0–0.5)
Eosinophils Relative: 9 %
HCT: 35.5 % — ABNORMAL LOW (ref 39.0–52.0)
Hemoglobin: 11.8 g/dL — ABNORMAL LOW (ref 13.0–17.0)
Immature Granulocytes: 1 %
Lymphocytes Relative: 17 %
Lymphs Abs: 1 10*3/uL (ref 0.7–4.0)
MCH: 29.6 pg (ref 26.0–34.0)
MCHC: 33.2 g/dL (ref 30.0–36.0)
MCV: 89.2 fL (ref 80.0–100.0)
Monocytes Absolute: 0.6 10*3/uL (ref 0.1–1.0)
Monocytes Relative: 11 %
Neutro Abs: 3.7 10*3/uL (ref 1.7–7.7)
Neutrophils Relative %: 61 %
Platelets: 236 10*3/uL (ref 150–400)
RBC: 3.98 MIL/uL — ABNORMAL LOW (ref 4.22–5.81)
RDW: 14.2 % (ref 11.5–15.5)
WBC: 6 10*3/uL (ref 4.0–10.5)
nRBC: 0 % (ref 0.0–0.2)

## 2019-12-15 LAB — COMPREHENSIVE METABOLIC PANEL
ALT: 11 U/L (ref 0–44)
AST: 17 U/L (ref 15–41)
Albumin: 3.8 g/dL (ref 3.5–5.0)
Alkaline Phosphatase: 79 U/L (ref 38–126)
Anion gap: 9 (ref 5–15)
BUN: 19 mg/dL (ref 8–23)
CO2: 25 mmol/L (ref 22–32)
Calcium: 9.2 mg/dL (ref 8.9–10.3)
Chloride: 106 mmol/L (ref 98–111)
Creatinine, Ser: 1.25 mg/dL — ABNORMAL HIGH (ref 0.61–1.24)
GFR calc Af Amer: >60 mL/min (ref >60)
GFR calc non Af Amer: 53 mL/min — ABNORMAL LOW (ref >60)
Glucose, Bld: 107 mg/dL — ABNORMAL HIGH (ref 70–99)
Potassium: 3.7 mmol/L (ref 3.5–5.1)
Sodium: 140 mmol/L (ref 135–145)
Total Bilirubin: 1.2 mg/dL (ref 0.3–1.2)
Total Protein: 6 g/dL — ABNORMAL LOW (ref 6.5–8.1)

## 2019-12-15 LAB — PSA: Prostatic Specific Antigen: 4.88 ng/mL — ABNORMAL HIGH (ref 0.00–4.00)

## 2019-12-15 NOTE — Patient Instructions (Signed)
[facility] at [facility] Discharge Instructions  You were seen today by Dr. Katragadda. He went over your recent lab results. He will see you back in 4 weeks for labs and follow up.   Thank you for choosing [facility] at [facility] to provide your oncology and hematology care.  To afford each patient quality time with our provider, please arrive at least 15 minutes before your scheduled appointment time.   If you have a lab appointment with the Cancer Center please come in thru the  Main Entrance and check in at the main information desk  You need to re-schedule your appointment should you arrive 10 or more minutes late.  We strive to give you quality time with our providers, and arriving late affects you and other patients whose appointments are after yours.  Also, if you no show three or more times for appointments you may be dismissed from the clinic at the providers discretion.     Again, thank you for choosing [facility].  Our hope is that these requests will decrease the amount of time that you wait before being seen by our physicians.       _____________________________________________________________  Should you have questions after your visit to [facility], please contact our office at [phone] between the hours of 8:00 a.m. and 4:30 p.m.  Voicemails left after 4:00 p.m. will not be returned until the following business day.  For prescription refill requests, have your pharmacy contact our office and allow 72 hours.    Cancer Center Support Programs:   > Cancer Support Group  2nd Tuesday of the month 1pm-2pm, Journey Room    

## 2019-12-15 NOTE — Assessment & Plan Note (Addendum)
1.  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  Prostate biopsy on 02/06/2015 consistent with adenocarcinoma, Gleason 4+5= 9. -Zytiga and prednisone from 05/05/2016 through 10/11/2019 with progression. -Guardant 360 showed some obscure mutation with a clinical trial in [place]. -Last Lupron 45 mg on 11/16/2019. -Enzalutamide started on 11/03/2018.  He is taking two 80 mg tablets.  I have reviewed his labs today. -Last PSA was 5.96 on 11/16/2019.  PSA today is pending.  I plan to see him back in 4 weeks with repeat labs. -His weight has gone up by 5 pounds in the last 4 weeks.  He reports that his weight has been stable on the scale at home.  He has developed trace lower extremity edema which can happen in 15% of people on enzalutamide.  If it is any worse, will consider low-dose Lasix.  2.  Bone metastasis: -Continue vitamin D 50,000 units weekly.  3.  Atrial fibrillation: -Continue amiodarone 200 mg daily.  Eliquis 5 mg twice daily.  Continue Toprol-XL 25 mg daily. -Talk to your cardiologist about drug interaction between enzalutamide and amiodarone.  Amiodarone concentration is decreased by enzalutamide.

## 2019-12-15 NOTE — Progress Notes (Signed)
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  CURRENT THERAPY:Enzalutamide.     INTERVAL HISTORY:  Ryan Walls 86 y.o. male seen for follow-up of metastatic prostate cancer to the bones.  Appetite is 100%.  Energy levels are 75%.  Wife reports that his energy levels have become low lately.  Also his weight has gone up by 5 pounds.  Denies any nausea vomiting or diarrhea.  No new onset pains reported.   REVIEW OF SYSTEMS:  Review of Systems  Cardiovascular: Positive for leg swelling.  Psychiatric/Behavioral: Positive for sleep disturbance.  All other systems reviewed and are negative.    PAST MEDICAL/SURGICAL HISTORY:  Past Medical History:  Diagnosis Date  . Prostate cancer [facility])    History reviewed. No pertinent surgical history.   SOCIAL HISTORY:  Social History   Socioeconomic History  . Marital status: Married    Spouse name: Not on file  . Number of children: Not on file  . Years of education: Not on file  . Highest education level: Not on file  Occupational History  . Not on file  Tobacco Use  . Smoking status: Former Smoker    Types: Cigarettes    Quit date: 11/10/1977    Years since quitting: 42.1  . Smokeless tobacco: Never Used  Substance and Sexual Activity  . Alcohol use: Not Currently  . Drug use: Never  . Sexual activity: Not Currently  Other Topics Concern  . Not on file  Social History Narrative  . Not on file   Social Determinants of Health   Financial Resource Strain:   . Difficulty of Paying Living Expenses:   Food Insecurity:   . Worried About Charity fundraiser in the Last Year:   . Arboriculturist in the Last Year:   Transportation Needs:   . Film/video editor (Medical):   [name] Lack of  Transportation (Non-Medical):   Physical Activity:   . Days of Exercise per Week:   . Minutes of Exercise per Session:   Stress:   . Feeling of Stress :   Social Connections:   . Frequency of Communication with Friends and Family:   . Frequency of Social Gatherings with Friends and Family:   . Attends Religious Services:   . Active Member of Clubs or Organizations:   . Attends Archivist Meetings:   [name] Marital Status:   Intimate Partner Violence:   . Fear of Current or Ex-Partner:   . Emotionally Abused:   [name] Physically Abused:   . Sexually Abused:     FAMILY HISTORY:  History reviewed. No pertinent family history.  CURRENT MEDICATIONS:  Outpatient Encounter Medications as of 12/15/2019  Medication Sig  . amiodarone (PACERONE) 200 MG tablet Take 200 mg by mouth daily.   . bimatoprost (LUMIGAN) 0.01 % SOLN Place 1 drop into both eyes at bedtime.   [name] ELIQUIS 5 MG TABS tablet Take 5 mg by mouth 2 (two) times daily.  . enzalutamide (XTANDI) 40 MG capsule Take 4 capsules (160 mg total) by mouth daily. May substitute 80mg  tablets if available. Take 80 mg for 1 week, and increase to 160 mg daily  . metoprolol succinate (TOPROL-XL) 25 MG 24 hr tablet Take 25 mg by mouth every morning.  [name]  Vitamin D, Ergocalciferol, (DRISDOL) 50000 units CAPS capsule Take 50,000 Units by mouth daily.   . [DISCONTINUED] predniSONE (DELTASONE) 5 MG tablet Take 2 tablets (10 mg total) by mouth 2 (two) times daily with a meal.  . [DISCONTINUED] TRAVATAN Z 0.004 % SOLN ophthalmic solution Place 1 drop into both eyes at bedtime.    Facility-Administered Encounter Medications as of 12/15/2019  Medication  . Leuprolide Acetate (6 Month) (LUPRON) injection 45 mg    ALLERGIES:  No Known Allergies   PHYSICAL EXAM:  ECOG Performance status: 1  Vitals:   12/15/19 1459  BP: (!) 160/79  Pulse: 64  Resp: 18  Temp: (!) 97.3 F (36.3 C)  SpO2: 100%   Filed Weights   12/15/19 1459  Weight: 243 lb  11.2 oz (110.5 kg)    Physical Exam Vitals reviewed.  Constitutional:      Appearance: Normal appearance.  Cardiovascular:     Rate and Rhythm: Normal rate and regular rhythm.     Heart sounds: Normal heart sounds.  Pulmonary:     Effort: Pulmonary effort is normal.     Breath sounds: Normal breath sounds.  Abdominal:     General: There is no distension.     Palpations: Abdomen is soft. There is no mass.  Musculoskeletal:        General: No swelling.  Skin:    General: Skin is warm.  Neurological:     General: No focal deficit present.     Mental Status: He is alert and oriented to person, place, and time.  Psychiatric:        Mood and Affect: Mood normal.        Behavior: Behavior normal.      LABORATORY DATA:  I have reviewed the labs as listed.  CBC    Component Value Date/Time   WBC 6.0 12/15/2019 1427   RBC 3.98 (L) 12/15/2019 1427   HGB 11.8 (L) 12/15/2019 1427   HCT 35.5 (L) 12/15/2019 1427   PLT 236 12/15/2019 1427   MCV 89.2 12/15/2019 1427   MCH 29.6 12/15/2019 1427   MCHC 33.2 12/15/2019 1427   RDW 14.2 12/15/2019 1427   LYMPHSABS 1.0 12/15/2019 1427   MONOABS 0.6 12/15/2019 1427   EOSABS 0.5 12/15/2019 1427   BASOSABS 0.1 12/15/2019 1427   CMP Latest Ref Rng & Units 12/15/2019 11/16/2019 07/26/2019  Glucose 70 - 99 mg/dL 107(H) 106(H) 91  BUN 8 - 23 mg/dL 19 19 19   Creatinine 0.61 - 1.24 mg/dL 1.25(H) 1.20 1.33(H)  Sodium 135 - 145 mmol/L 140 138 137  Potassium 3.5 - 5.1 mmol/L 3.7 3.8 4.4  Chloride 98 - 111 mmol/L 106 105 102  CO2 22 - 32 mmol/L 25 25 25   Calcium 8.9 - 10.3 mg/dL 9.2 9.0 9.1  Total Protein 6.5 - 8.1 g/dL 6.0(L) 6.0(L) 6.3(L)  Total Bilirubin 0.3 - 1.2 mg/dL 1.2 1.1 1.3(H)  Alkaline Phos 38 - 126 U/L 79 62 85  AST 15 - 41 U/L 17 19 18   ALT 0 - 44 U/L 11 15 16        DIAGNOSTIC IMAGING:  I have reviewed scans.   I have reviewed Venita Lick LPN's note and agree with the documentation.  I personally performed a  face-to-face visit, made revisions and my assessment and plan is as follows.    ASSESSMENT & PLAN:   Prostate cancer metastatic to bone ([place]) 1.  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  Prostate biopsy on 02/06/2015 consistent with adenocarcinoma, Gleason 4+5= 9. -Zytiga and prednisone from 05/05/2016 through 10/11/2019 with progression. -Guardant 360 showed some obscure mutation with a clinical trial in [place]. -Last Lupron 45 mg on 11/16/2019. -Enzalutamide started on 11/03/2018.  He is taking two 80 mg tablets.  I have reviewed his labs today. -Last PSA was 5.96 on 11/16/2019.  PSA today is pending.  I plan to see him back in 4 weeks with repeat labs. -His weight has gone up by 5 pounds in the last 4 weeks.  He reports that his weight has been stable on the scale at home.  He has developed trace lower extremity edema which can happen in 15% of people on enzalutamide.  If it is any worse, will consider low-dose Lasix.  2.  Bone metastasis: -Continue vitamin D 50,000 units weekly.  3.  Atrial fibrillation: -Continue amiodarone 200 mg daily.  Eliquis 5 mg twice daily.  Continue Toprol-XL 25 mg daily. -Talk to your cardiologist about drug interaction between enzalutamide and amiodarone.  Amiodarone concentration is decreased by enzalutamide.     Orders placed this encounter:  No orders of the defined types were placed in this encounter.     Derek Jack, MD [place] [phone]

## 2019-12-30 ENCOUNTER — Other Ambulatory Visit (HOSPITAL_COMMUNITY): Payer: Self-pay | Admitting: Hematology

## 2020-01-03 ENCOUNTER — Other Ambulatory Visit (HOSPITAL_COMMUNITY): Payer: Self-pay | Admitting: *Deleted

## 2020-01-03 MED ORDER — ENZALUTAMIDE 80 MG PO TABS: 160.0000 mg | ORAL_TABLET | Freq: Every day | ORAL | 6 refills | Status: DC

## 2020-01-10 ENCOUNTER — Other Ambulatory Visit (HOSPITAL_COMMUNITY): Payer: Self-pay | Admitting: *Deleted

## 2020-01-10 MED ORDER — RIVAROXABAN 20 MG PO TABS: 20.0000 mg | ORAL_TABLET | Freq: Every day | ORAL | 6 refills | Status: DC

## 2020-01-10 NOTE — Progress Notes (Signed)
[facility] called stating that there was a flag of high interaction with Eliquis and Xtandi.  They want clarification from Dr. Delton Coombes if it needs to be changed.    Per Dr. Delton Coombes, change patient to Xarelto 20 mg daily. No loading dose. Stop the Eliquis.    I have sent the new prescription to the pharmacy and I have updated that patient on the change in drug.  He verbalizes understanding.

## 2020-01-18 ENCOUNTER — Inpatient Hospital Stay (HOSPITAL_COMMUNITY): Payer: Medicare Other

## 2020-01-18 ENCOUNTER — Inpatient Hospital Stay (HOSPITAL_COMMUNITY): Payer: Medicare Other | Attending: Hematology | Admitting: Hematology

## 2020-01-18 ENCOUNTER — Other Ambulatory Visit: Payer: Self-pay

## 2020-01-18 VITALS — BP 115/53 | HR 66 | Temp 96.9°F | Resp 19 | Wt 237.4 lb

## 2020-01-18 DIAGNOSIS — C61 Malignant neoplasm of prostate: Secondary | ICD-10-CM

## 2020-01-18 DIAGNOSIS — Z87891 Personal history of nicotine dependence: Secondary | ICD-10-CM | POA: Diagnosis not present

## 2020-01-18 DIAGNOSIS — Z192 Hormone resistant malignancy status: Secondary | ICD-10-CM | POA: Diagnosis not present

## 2020-01-18 DIAGNOSIS — I4891 Unspecified atrial fibrillation: Secondary | ICD-10-CM | POA: Insufficient documentation

## 2020-01-18 DIAGNOSIS — C7951 Secondary malignant neoplasm of bone: Secondary | ICD-10-CM

## 2020-01-18 DIAGNOSIS — Z79899 Other long term (current) drug therapy: Secondary | ICD-10-CM | POA: Diagnosis not present

## 2020-01-18 LAB — CBC WITH DIFFERENTIAL/PLATELET
Abs Immature Granulocytes: 0.03 10*3/uL (ref 0.00–0.07)
Basophils Absolute: 0 10*3/uL (ref 0.0–0.1)
Basophils Relative: 0 %
Eosinophils Absolute: 0.4 10*3/uL (ref 0.0–0.5)
Eosinophils Relative: 5 %
HCT: 36.7 % — ABNORMAL LOW (ref 39.0–52.0)
Hemoglobin: 12.3 g/dL — ABNORMAL LOW (ref 13.0–17.0)
Immature Granulocytes: 0 %
Lymphocytes Relative: 9 %
Lymphs Abs: 0.7 10*3/uL (ref 0.7–4.0)
MCH: 29.3 pg (ref 26.0–34.0)
MCHC: 33.5 g/dL (ref 30.0–36.0)
MCV: 87.4 fL (ref 80.0–100.0)
Monocytes Absolute: 0.6 10*3/uL (ref 0.1–1.0)
Monocytes Relative: 8 %
Neutro Abs: 5.9 10*3/uL (ref 1.7–7.7)
Neutrophils Relative %: 78 %
Platelets: 209 10*3/uL (ref 150–400)
RBC: 4.2 MIL/uL — ABNORMAL LOW (ref 4.22–5.81)
RDW: 13.8 % (ref 11.5–15.5)
WBC: 7.7 10*3/uL (ref 4.0–10.5)
nRBC: 0 % (ref 0.0–0.2)

## 2020-01-18 LAB — COMPREHENSIVE METABOLIC PANEL
ALT: 12 U/L (ref 0–44)
AST: 20 U/L (ref 15–41)
Albumin: 4 g/dL (ref 3.5–5.0)
Alkaline Phosphatase: 84 U/L (ref 38–126)
Anion gap: 11 (ref 5–15)
BUN: 15 mg/dL (ref 8–23)
CO2: 25 mmol/L (ref 22–32)
Calcium: 9.2 mg/dL (ref 8.9–10.3)
Chloride: 100 mmol/L (ref 98–111)
Creatinine, Ser: 1.19 mg/dL (ref 0.61–1.24)
GFR calc Af Amer: >60 mL/min (ref >60)
GFR calc non Af Amer: 56 mL/min — ABNORMAL LOW (ref >60)
Glucose, Bld: 117 mg/dL — ABNORMAL HIGH (ref 70–99)
Potassium: 4.2 mmol/L (ref 3.5–5.1)
Sodium: 136 mmol/L (ref 135–145)
Total Bilirubin: 1 mg/dL (ref 0.3–1.2)
Total Protein: 6.3 g/dL — ABNORMAL LOW (ref 6.5–8.1)

## 2020-01-18 LAB — PSA: Prostatic Specific Antigen: 3.8 ng/mL (ref 0.00–4.00)

## 2020-01-18 NOTE — Progress Notes (Signed)
[place], [place] [address]   CLINIC:  Medical Oncology/Hematology  PCP:  Olena Mater, MD [address] / [place] [address] [phone]   REASON FOR VISIT:  Follow-up for metastatic castration refractory prostate cancer to the bones  CURRENT THERAPY: Enzalutamide  BRIEF ONCOLOGIC HISTORY:  Oncology History  Prostate cancer metastatic to bone ([place])  02/16/2015 Initial Diagnosis   Prostate cancer metastatic to bone ([place])   10/24/2019 Genetic Testing   Guardant 360 results:         CANCER STAGING: Cancer Staging No matching staging information was found for the patient.  INTERVAL HISTORY:  [name], a 86 y.o. 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 Ryan Walls was last seen on 12/15/2019.   Today he is accompanied by his wife. He has recently seen Dr. Luiz Ochoa and was started on Xarelto. He reports tolerating the enzalutamide well.  Denies any falls.   REVIEW OF SYSTEMS:  Review of Systems  Constitutional: Negative for appetite change and fatigue.  All other systems reviewed and are negative.   PAST MEDICAL/SURGICAL HISTORY:  Past Medical History:  Diagnosis Date   Prostate cancer ([place])    No past surgical history on file.  SOCIAL HISTORY:  Social History   Socioeconomic History   Marital status: Married    Spouse name: Not on file   Number of children: Not on file   Years of education: Not on file   Highest education level: Not on file  Occupational History   Not on file  Tobacco Use   Smoking status: Former Smoker    Types: Cigarettes    Quit date: 11/10/1977    Years since quitting: 42.2   Smokeless tobacco: Never Used  Vaping Use   Vaping Use: Never used  Substance and Sexual Activity   Alcohol use: Not Currently   Drug use: Never   Sexual activity: Not Currently  Other Topics Concern   Not on file  Social  History Narrative   Not on file   Social Determinants of Health   Financial Resource Strain:    Difficulty of Paying Living Expenses:   Food Insecurity:    Worried About Charity fundraiser in the Last Year:    Arboriculturist in the Last Year:   Transportation Needs:    Film/video editor (Medical):    Lack of Transportation (Non-Medical):   Physical Activity:    Days of Exercise per Week:    Minutes of Exercise per Session:   Stress:    Feeling of Stress :   Social Connections:    Frequency of Communication with Friends and Family:    Frequency of Social Gatherings with Friends and Family:    Attends Religious Services:    Active Member of Clubs or Organizations:    Attends Music therapist:    Marital Status:   Intimate Partner Violence:    Fear of Current or Ex-Partner:    Emotionally Abused:    Physically Abused:    Sexually Abused:     FAMILY HISTORY:  No family history on file.  CURRENT MEDICATIONS:  Current Outpatient Medications  Medication Sig Dispense Refill   amiodarone (PACERONE) 200 MG tablet Take 200 mg by mouth daily.      bimatoprost (LUMIGAN) 0.01 % SOLN Place 1 drop into both eyes at bedtime.      enzalutamide (XTANDI) 80 MG tablet  Take 2 tablets (160 mg total) by mouth daily. 60 tablet 6   metoprolol succinate (TOPROL-XL) 25 MG 24 hr tablet Take 25 mg by mouth every morning.  6   rivaroxaban (XARELTO) 20 MG TABS tablet Take 1 tablet (20 mg total) by mouth daily with supper. 30 tablet 6   Vitamin D, Ergocalciferol, (DRISDOL) 50000 units CAPS capsule Take 50,000 Units by mouth daily.      No current facility-administered medications for this visit.   Facility-Administered Medications Ordered in Other Visits  Medication Dose Route Frequency Provider Last Rate Last Admin   Leuprolide Acetate (6 Month) (LUPRON) injection 45 mg  45 mg Intramuscular Once Derek Jack, MD        ALLERGIES:  No Known  Allergies  PHYSICAL EXAM:  Performance status (ECOG): 1 - Symptomatic but completely ambulatory  Vitals:   01/18/20 1156  BP: (!) 115/53  Pulse: 66  Resp: 19  Temp: (!) 96.9 F (36.1 C)  SpO2: 100%   Wt Readings from Last 3 Encounters:  01/18/20 237 lb 6.4 oz (107.7 kg)  12/15/19 243 lb 11.2 oz (110.5 kg)  11/16/19 238 lb 6.4 oz (108.1 kg)   Physical Exam Vitals reviewed.  Constitutional:      Appearance: Normal appearance.  Cardiovascular:     Rate and Rhythm: Normal rate and regular rhythm.     Pulses: Normal pulses.     Heart sounds: Normal heart sounds.  Pulmonary:     Effort: Pulmonary effort is normal.     Breath sounds: Normal breath sounds.  Musculoskeletal:     Right lower leg: Edema (trace) present.     Left lower leg: Edema (trace) present.  Neurological:     General: No focal deficit present.     Mental Status: He is alert and oriented to person, place, and time.  Psychiatric:        Mood and Affect: Mood normal.        Behavior: Behavior normal.      LABORATORY DATA:  I have reviewed the labs as listed.  CBC Latest Ref Rng & Units 01/18/2020 12/15/2019 11/16/2019  WBC 4.0 - 10.5 K/uL 7.7 6.0 6.7  Hemoglobin 13.0 - 17.0 g/dL 12.3(L) 11.8(L) 12.3(L)  Hematocrit 39 - 52 % 36.7(L) 35.5(L) 36.9(L)  Platelets 150 - 400 K/uL 209 236 230   CMP Latest Ref Rng & Units 01/18/2020 12/15/2019 11/16/2019  Glucose 70 - 99 mg/dL 117(H) 107(H) 106(H)  BUN 8 - 23 mg/dL 15 19 19   Creatinine 0.61 - 1.24 mg/dL 1.19 1.25(H) 1.20  Sodium 135 - 145 mmol/L 136 140 138  Potassium 3.5 - 5.1 mmol/L 4.2 3.7 3.8  Chloride 98 - 111 mmol/L 100 106 105  CO2 22 - 32 mmol/L 25 25 25   Calcium 8.9 - 10.3 mg/dL 9.2 9.2 9.0  Total Protein 6.5 - 8.1 g/dL 6.3(L) 6.0(L) 6.0(L)  Total Bilirubin 0.3 - 1.2 mg/dL 1.0 1.2 1.1  Alkaline Phos 38 - 126 U/L 84 79 62  AST 15 - 41 U/L 20 17 19   ALT 0 - 44 U/L 12 11 15     DIAGNOSTIC IMAGING:  I have independently reviewed the scans and discussed  with the patient.   ASSESSMENT:  1.  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 -Prostate biopsy on 02/06/2015 consistent with adenocarcinoma, Gleason 4+5 =9. -Zytiga and prednisone from 05/05/2016 through 10/11/2019 with progression. -Current 360 showed some obscure mutation with a clinical  trial in [place]. -Last Lupron 45 mg on 11/16/2019. -Enzalutamide started on 11/03/2018.   PLAN:  1.  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 -We reviewed his chemistries which are within normal limits.  His PSA improved to 3.8. -I plan to see him back in 2 months for follow-up.  If PSA continues to improve, will consider switching him to a 3 to 36-month visit.  2.  Bone metastasis: -Continue vitamin D 50,000 units weekly.  3.  Atrial fibrillation: -Continue amiodarone 200 mg daily. -Eliquis was discontinued secondary to interaction with enzalutamide.  Rivaroxaban was started. -Continue Toprol-XL 25 mg daily.    Orders placed this encounter:  No orders of the defined types were placed in this encounter.    Derek Jack, MD [place] [phone]   I, Milinda Antis, am acting as a scribe for Dr. Sanda Linger.  I, Derek Jack MD, have reviewed the above documentation for accuracy and completeness, and I agree with the above.

## 2020-01-18 NOTE — Patient Instructions (Signed)
[place] at [facility] Discharge Instructions  You were seen today by Dr. Delton Coombes. He went over your recent results. Monitor for any symptoms while taking the enzalutamide, including seizures. Dr. Delton Coombes will see you back in 2 months for labs and follow up.   Thank you for choosing [place] at [facility] to provide your oncology and hematology care.  To afford each patient quality time with our provider, please arrive at least 15 minutes before your scheduled appointment time.   If you have a lab appointment with the [facility] please come in thru the Main Entrance and check in at the main information desk  You need to re-schedule your appointment should you arrive 10 or more minutes late.  We strive to give you quality time with our providers, and arriving late affects you and other patients whose appointments are after yours.  Also, if you no show three or more times for appointments you may be dismissed from the clinic at the providers discretion.     Again, thank you for choosing [facility].  Our hope is that these requests will decrease the amount of time that you wait before being seen by our physicians.       _____________________________________________________________  Should you have questions after your visit to [name], please contact our office at [phone] between the hours of 8:00 a.m. and 4:30 p.m.  Voicemails left after 4:00 p.m. will not be returned until the following business day.  For prescription refill requests, have your pharmacy contact our office and allow 72 hours.    Cancer Center Support Programs:   > Cancer Support Group  2nd Tuesday of the month 1pm-2pm, Journey Room

## 2020-03-22 ENCOUNTER — Inpatient Hospital Stay (HOSPITAL_COMMUNITY): Payer: No Typology Code available for payment source | Attending: Hematology | Admitting: Hematology

## 2020-03-22 ENCOUNTER — Inpatient Hospital Stay (HOSPITAL_COMMUNITY): Payer: No Typology Code available for payment source

## 2020-05-17 ENCOUNTER — Ambulatory Visit (HOSPITAL_COMMUNITY): Payer: No Typology Code available for payment source

## 2020-06-07 ENCOUNTER — Inpatient Hospital Stay (HOSPITAL_BASED_OUTPATIENT_CLINIC_OR_DEPARTMENT_OTHER): Payer: Medicare Other | Admitting: Hematology

## 2020-06-07 ENCOUNTER — Inpatient Hospital Stay (HOSPITAL_COMMUNITY): Payer: Medicare Other | Attending: Hematology

## 2020-06-07 ENCOUNTER — Other Ambulatory Visit: Payer: Self-pay

## 2020-06-07 ENCOUNTER — Inpatient Hospital Stay (HOSPITAL_COMMUNITY): Payer: Medicare Other

## 2020-06-07 VITALS — BP 111/69 | HR 73 | Temp 96.8°F | Resp 20 | Wt 233.0 lb

## 2020-06-07 DIAGNOSIS — C7951 Secondary malignant neoplasm of bone: Secondary | ICD-10-CM | POA: Diagnosis not present

## 2020-06-07 DIAGNOSIS — Z79818 Long term (current) use of other agents affecting estrogen receptors and estrogen levels: Secondary | ICD-10-CM | POA: Diagnosis not present

## 2020-06-07 DIAGNOSIS — I4891 Unspecified atrial fibrillation: Secondary | ICD-10-CM | POA: Insufficient documentation

## 2020-06-07 DIAGNOSIS — Z79899 Other long term (current) drug therapy: Secondary | ICD-10-CM | POA: Insufficient documentation

## 2020-06-07 DIAGNOSIS — C61 Malignant neoplasm of prostate: Secondary | ICD-10-CM

## 2020-06-07 DIAGNOSIS — Z87891 Personal history of nicotine dependence: Secondary | ICD-10-CM | POA: Insufficient documentation

## 2020-06-07 DIAGNOSIS — Z7901 Long term (current) use of anticoagulants: Secondary | ICD-10-CM | POA: Insufficient documentation

## 2020-06-07 LAB — CBC WITH DIFFERENTIAL/PLATELET
Abs Immature Granulocytes: 0.03 10*3/uL (ref 0.00–0.07)
Basophils Absolute: 0 10*3/uL (ref 0.0–0.1)
Basophils Relative: 1 %
Eosinophils Absolute: 0.2 10*3/uL (ref 0.0–0.5)
Eosinophils Relative: 4 %
HCT: 38.8 % — ABNORMAL LOW (ref 39.0–52.0)
Hemoglobin: 13.3 g/dL (ref 13.0–17.0)
Immature Granulocytes: 1 %
Lymphocytes Relative: 10 %
Lymphs Abs: 0.5 10*3/uL — ABNORMAL LOW (ref 0.7–4.0)
MCH: 30 pg (ref 26.0–34.0)
MCHC: 34.3 g/dL (ref 30.0–36.0)
MCV: 87.6 fL (ref 80.0–100.0)
Monocytes Absolute: 0.7 10*3/uL (ref 0.1–1.0)
Monocytes Relative: 14 %
Neutro Abs: 3.5 10*3/uL (ref 1.7–7.7)
Neutrophils Relative %: 70 %
Platelets: 182 10*3/uL (ref 150–400)
RBC: 4.43 MIL/uL (ref 4.22–5.81)
RDW: 14.6 % (ref 11.5–15.5)
WBC: 4.9 10*3/uL (ref 4.0–10.5)
nRBC: 0 % (ref 0.0–0.2)

## 2020-06-07 LAB — COMPREHENSIVE METABOLIC PANEL
ALT: 12 U/L (ref 0–44)
AST: 18 U/L (ref 15–41)
Albumin: 4.2 g/dL (ref 3.5–5.0)
Alkaline Phosphatase: 102 U/L (ref 38–126)
Anion gap: 10 (ref 5–15)
BUN: 16 mg/dL (ref 8–23)
CO2: 24 mmol/L (ref 22–32)
Calcium: 9.1 mg/dL (ref 8.9–10.3)
Chloride: 99 mmol/L (ref 98–111)
Creatinine, Ser: 1.32 mg/dL — ABNORMAL HIGH (ref 0.61–1.24)
GFR, Estimated: 53 mL/min — ABNORMAL LOW (ref >60)
Glucose, Bld: 113 mg/dL — ABNORMAL HIGH (ref 70–99)
Potassium: 4 mmol/L (ref 3.5–5.1)
Sodium: 133 mmol/L — ABNORMAL LOW (ref 135–145)
Total Bilirubin: 1.2 mg/dL (ref 0.3–1.2)
Total Protein: 6.5 g/dL (ref 6.5–8.1)

## 2020-06-07 LAB — PSA: Prostatic Specific Antigen: 3.96 ng/mL (ref 0.00–4.00)

## 2020-06-07 MED ORDER — LEUPROLIDE ACETATE (6 MONTH) 45 MG ~~LOC~~ KIT
45.0000 mg | PACK | Freq: Once | SUBCUTANEOUS | Status: AC
  Administered 2020-06-07: 45 mg via SUBCUTANEOUS
  Filled 2020-06-07: qty 45

## 2020-06-07 NOTE — Patient Instructions (Signed)
[place] at [facility] Discharge Instructions  You were seen today by Dr. Delton Coombes. He went over your recent results. You received your Lupron injection today; continue receiving your injection every 6 months. Dr. Delton Coombes will see you back in 3 months for labs and follow up.   Thank you for choosing [place] at [facility] to provide your oncology and hematology care.  To afford each patient quality time with our provider, please arrive at least 15 minutes before your scheduled appointment time.   If you have a lab appointment with the [facility] please come in thru the Main Entrance and check in at the main information desk  You need to re-schedule your appointment should you arrive 10 or more minutes late.  We strive to give you quality time with our providers, and arriving late affects you and other patients whose appointments are after yours.  Also, if you no show three or more times for appointments you may be dismissed from the clinic at the providers discretion.     Again, thank you for choosing [facility].  Our hope is that these requests will decrease the amount of time that you wait before being seen by our physicians.       _____________________________________________________________  Should you have questions after your visit to [facility], please contact our office at [phone] between the hours of 8:00 a.m. and 4:30 p.m.  Voicemails left after 4:00 p.m. will not be returned until the following business day.  For prescription refill requests, have your pharmacy contact our office and allow 72 hours.    Cancer Center Support Programs:   > Cancer Support Group  2nd Tuesday of the month 1pm-2pm, Journey Room

## 2020-06-07 NOTE — Progress Notes (Signed)
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  INTERVAL HISTORY:  [name], a 86 y.o. 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 Ryan Walls was last seen on 01/18/2020.   Today he is accompanied by his wife, Ryan Walls, and he reports feeling well. He is taking Xtandi 2 tablets daily and is tolerating it well. He complains of having difficulty sleeping and does not wake up refreshed. He has had both knees replaced which has limited his physical activity, especially worse first thing in the morning.   REVIEW OF SYSTEMS:  Review of Systems  Constitutional: Positive for fatigue (75%). Negative for appetite change.  Musculoskeletal: Positive for arthralgias (bilat knee pains after bilat knee replacements).  Psychiatric/Behavioral: Positive for sleep disturbance.  All other systems reviewed and are negative.   PAST MEDICAL/SURGICAL HISTORY:  Past Medical History:  Diagnosis Date  . Prostate cancer ([place])    No past surgical history on file.  SOCIAL HISTORY:  Social History   Socioeconomic History  . Marital status: Married    Spouse name: Not on file  . Number of children: Not on file  . Years of  education: Not on file  . Highest education level: Not on file  Occupational History  . Not on file  Tobacco Use  . Smoking status: Former Smoker    Types: Cigarettes    Quit date: 11/10/1977    Years since quitting: 42.6  . Smokeless tobacco: Never Used  Vaping Use  . Vaping Use: Never used  Substance and Sexual Activity  . Alcohol use: Not Currently  . Drug use: Never  . Sexual activity: Not Currently  Other Topics Concern  . Not on file  Social History Narrative  . Not on file   Social Determinants of Health   Financial Resource Strain: Low Risk   . Difficulty of Paying Living Expenses: Not hard at all  Food Insecurity: No Food Insecurity  . Worried About Charity fundraiser in the Last Year: Never true  . Ran Out of Food in the Last Year: Never true  Transportation Needs: No Transportation Needs  . Lack of Transportation (Medical): No  . Lack of Transportation (Non-Medical): No  Physical Activity: Insufficiently Active  . Days of Exercise per Week: 3 days  . Minutes of Exercise per Session: 20 min  Stress: No Stress Concern Present  . Feeling of Stress : Not at all  Social Connections: Moderately Integrated  . Frequency of Communication with Friends and Family: More than three times a week  . Frequency of Social Gatherings with Friends and Family: More than three times a week  . Attends Religious Services: 1 to 4 times per year  . Active Member of Clubs or Organizations: No  .  Attends Archivist Meetings: Never  . Marital Status: Married  Human resources officer Violence: Not At Risk  . Fear of Current or Ex-Partner: No  . Emotionally Abused: No  . Physically Abused: No  . Sexually Abused: No    FAMILY HISTORY:  No family history on file.  CURRENT MEDICATIONS:  Current Outpatient Medications  Medication Sig Dispense Refill  . amiodarone (PACERONE) 200 MG tablet Take 200 mg by mouth daily.     . bimatoprost (LUMIGAN) 0.01 % SOLN Place 1 drop into both eyes  at bedtime.     . ciprofloxacin (CILOXAN) 0.3 % ophthalmic solution     . ELIQUIS 2.5 MG TABS tablet Take 2.5 mg by mouth 2 (two) times daily.    . enzalutamide (XTANDI) 80 MG tablet Take 2 tablets (160 mg total) by mouth daily. 60 tablet 6  . furosemide (LASIX) 20 MG tablet Take 20 mg by mouth daily.    . metoprolol succinate (TOPROL-XL) 25 MG 24 hr tablet Take 25 mg by mouth every morning.  6  . rivaroxaban (XARELTO) 20 MG TABS tablet Take 1 tablet (20 mg total) by mouth daily with supper. 30 tablet 6  . Vitamin D, Ergocalciferol, (DRISDOL) 50000 units CAPS capsule Take 50,000 Units by mouth daily.      No current facility-administered medications for this visit.   Facility-Administered Medications Ordered in Other Visits  Medication Dose Route Frequency Provider Last Rate Last Admin  . Leuprolide Acetate (6 Month) (LUPRON) injection 45 mg  45 mg Intramuscular Once Derek Jack, MD        ALLERGIES:  No Known Allergies  PHYSICAL EXAM:  Performance status (ECOG): 1 - Symptomatic but completely ambulatory  Vitals:   06/07/20 1335  BP: 111/69  Pulse: 73  Resp: 20  Temp: (!) 96.8 F (36 C)  SpO2: 100%   Wt Readings from Last 3 Encounters:  06/07/20 233 lb (105.7 kg)  01/18/20 237 lb 6.4 oz (107.7 kg)  12/15/19 243 lb 11.2 oz (110.5 kg)   Physical Exam Vitals reviewed.  Constitutional:      Appearance: Normal appearance.  Neurological:     General: No focal deficit present.     Mental Status: He is alert and oriented to person, place, and time.  Psychiatric:        Mood and Affect: Mood normal.        Behavior: Behavior normal.      LABORATORY DATA:  I have reviewed the labs as listed.  CBC Latest Ref Rng & Units 06/07/2020 01/18/2020 12/15/2019  WBC 4.0 - 10.5 K/uL 4.9 7.7 6.0  Hemoglobin 13.0 - 17.0 g/dL 13.3 12.3(L) 11.8(L)  Hematocrit 39 - 52 % 38.8(L) 36.7(L) 35.5(L)  Platelets 150 - 400 K/uL 182 209 236   CMP Latest Ref Rng & Units 06/07/2020 01/18/2020  12/15/2019  Glucose 70 - 99 mg/dL 113(H) 117(H) 107(H)  BUN 8 - 23 mg/dL '16 15 19  ' Creatinine 0.61 - 1.24 mg/dL 1.32(H) 1.19 1.25(H)  Sodium 135 - 145 mmol/L 133(L) 136 140  Potassium 3.5 - 5.1 mmol/L 4.0 4.2 3.7  Chloride 98 - 111 mmol/L 99 100 106  CO2 22 - 32 mmol/L '24 25 25  ' Calcium 8.9 - 10.3 mg/dL 9.1 9.2 9.2  Total Protein 6.5 - 8.1 g/dL 6.5 6.3(L) 6.0(L)  Total Bilirubin 0.3 - 1.2 mg/dL 1.2 1.0 1.2  Alkaline Phos 38 - 126 U/L 102 84 79  AST 15 - 41 U/L '18 20 17  ' ALT  0 - 44 U/L '12 12 11   ' PSA 3.80 01/18/2020  PSA 4.88 12/15/2019  PSA 5.96 11/16/2019    DIAGNOSTIC IMAGING:  I have independently reviewed the scans and discussed with the patient. No results found.   ASSESSMENT:  1. 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 -Prostate biopsy on 02/06/2015 consistent with adenocarcinoma, Gleason 4+5 =9. -Zytiga and prednisone from 05/05/2016 through 10/11/2019 with progression. -Current 360 showed some obscure mutation with a clinical trial in [place]. -Last Lupron 45 mg on 11/16/2019. -Enzalutamide started on 11/03/2018.   PLAN:  1. Metastatic CRPC to the bones: -He is tolerating enzalutamide 160 mg daily very well. -He is having some problems with mobility because of his knee pain.  He had bilateral knee replacements.  He will follow up with his orthopedic surgeon in the next few weeks. -Reviewed his labs which showed creatinine slightly elevated at 1.32.  LFTs are normal. -Will review PSA from today.  Last PSA was 3.80. -Continue enzalutamide.  He will receive Lupron injection today. -He is having sleep problems occasionally.  He does not wish to have any pharmacological intervention at this time. -RTC 3 to 4 months with labs.  2. Bone metastasis: -Continue vitamin D supplements.  3. Atrial fibrillation: -Continue amiodarone.  Continue Toprol-XL.  Continue Xarelto.   Orders placed this encounter:   No orders of the defined types were placed in this encounter.    Derek Jack, MD [place] [phone]   I, Milinda Antis, am acting as a scribe for Dr. Sanda Linger.  I, Derek Jack MD, have reviewed the above documentation for accuracy and completeness, and I agree with the above.

## 2020-07-26 ENCOUNTER — Other Ambulatory Visit (HOSPITAL_COMMUNITY): Payer: Self-pay | Admitting: Hematology

## 2020-07-26 NOTE — Telephone Encounter (Signed)
Chart reviewed. Xtandi refilled per Dr. Delton Coombes.

## 2020-08-27 ENCOUNTER — Telehealth (HOSPITAL_COMMUNITY): Payer: Self-pay | Admitting: Pharmacy Technician

## 2020-08-27 ENCOUNTER — Encounter (HOSPITAL_COMMUNITY): Payer: Self-pay | Admitting: Pharmacy Technician

## 2020-08-27 NOTE — Telephone Encounter (Signed)
Oral Oncology Patient Advocate Encounter  Wife called last week asking about assistance for [facility] for patient.  Designer, television/film set for [employer] to wife and she scanned it back to me. This will reduce the patient's out of pocket expense for Xtandi to $0.    Application completed and faxed to [phone] on 08/22/20.   [facility] phone number for follow up is [phone].   This encounter will be updated until final determination.  [facility] Patient Guide [place] Phone [phone] Fax [phone] 08/27/2020 8:39 AM

## 2020-08-27 NOTE — Telephone Encounter (Signed)
Oral Oncology Patient Advocate Encounter  Received notification from [facility] Patient Assistance program that patient has been successfully enrolled into their program to receive Xtandi from the manufacturer at $0 out of pocket until 08/03/21.   Case # [id number]   Specialty Pharmacy that will dispense medication is [facility].  Patient knows to call the office with questions or concerns.   Oral Oncology Clinic will continue to follow.  [facility] Patient [place] Phone [phone] Fax [phone] 08/27/2020 8:40 AM

## 2020-09-12 ENCOUNTER — Inpatient Hospital Stay (HOSPITAL_COMMUNITY): Payer: No Typology Code available for payment source

## 2020-09-12 ENCOUNTER — Ambulatory Visit (HOSPITAL_COMMUNITY): Payer: No Typology Code available for payment source | Admitting: Hematology

## 2020-10-11 ENCOUNTER — Other Ambulatory Visit: Payer: Self-pay

## 2020-10-11 ENCOUNTER — Inpatient Hospital Stay (HOSPITAL_BASED_OUTPATIENT_CLINIC_OR_DEPARTMENT_OTHER): Payer: Medicare Other | Admitting: Hematology

## 2020-10-11 ENCOUNTER — Inpatient Hospital Stay (HOSPITAL_COMMUNITY): Payer: Medicare Other | Attending: Hematology

## 2020-10-11 VITALS — BP 141/66 | HR 59 | Resp 16 | Wt 238.3 lb

## 2020-10-11 DIAGNOSIS — C7951 Secondary malignant neoplasm of bone: Secondary | ICD-10-CM

## 2020-10-11 DIAGNOSIS — Z79899 Other long term (current) drug therapy: Secondary | ICD-10-CM | POA: Diagnosis not present

## 2020-10-11 DIAGNOSIS — C61 Malignant neoplasm of prostate: Secondary | ICD-10-CM | POA: Insufficient documentation

## 2020-10-11 DIAGNOSIS — Z7901 Long term (current) use of anticoagulants: Secondary | ICD-10-CM | POA: Diagnosis not present

## 2020-10-11 DIAGNOSIS — I4891 Unspecified atrial fibrillation: Secondary | ICD-10-CM | POA: Diagnosis not present

## 2020-10-11 DIAGNOSIS — Z192 Hormone resistant malignancy status: Secondary | ICD-10-CM | POA: Insufficient documentation

## 2020-10-11 DIAGNOSIS — Z87891 Personal history of nicotine dependence: Secondary | ICD-10-CM | POA: Insufficient documentation

## 2020-10-11 LAB — CBC WITH DIFFERENTIAL/PLATELET
Abs Immature Granulocytes: 0.03 10*3/uL (ref 0.00–0.07)
Basophils Absolute: 0 10*3/uL (ref 0.0–0.1)
Basophils Relative: 1 %
Eosinophils Absolute: 0.3 10*3/uL (ref 0.0–0.5)
Eosinophils Relative: 5 %
HCT: 38.9 % — ABNORMAL LOW (ref 39.0–52.0)
Hemoglobin: 13.5 g/dL (ref 13.0–17.0)
Immature Granulocytes: 1 %
Lymphocytes Relative: 17 %
Lymphs Abs: 1 10*3/uL (ref 0.7–4.0)
MCH: 30.7 pg (ref 26.0–34.0)
MCHC: 34.7 g/dL (ref 30.0–36.0)
MCV: 88.4 fL (ref 80.0–100.0)
Monocytes Absolute: 0.6 10*3/uL (ref 0.1–1.0)
Monocytes Relative: 9 %
Neutro Abs: 4.1 10*3/uL (ref 1.7–7.7)
Neutrophils Relative %: 67 %
Platelets: 183 10*3/uL (ref 150–400)
RBC: 4.4 MIL/uL (ref 4.22–5.81)
RDW: 13.4 % (ref 11.5–15.5)
WBC: 6.1 10*3/uL (ref 4.0–10.5)
nRBC: 0 % (ref 0.0–0.2)

## 2020-10-11 LAB — COMPREHENSIVE METABOLIC PANEL
ALT: 14 U/L (ref 0–44)
AST: 21 U/L (ref 15–41)
Albumin: 4.1 g/dL (ref 3.5–5.0)
Alkaline Phosphatase: 91 U/L (ref 38–126)
Anion gap: 9 (ref 5–15)
BUN: 18 mg/dL (ref 8–23)
CO2: 24 mmol/L (ref 22–32)
Calcium: 8.9 mg/dL (ref 8.9–10.3)
Chloride: 103 mmol/L (ref 98–111)
Creatinine, Ser: 1.23 mg/dL (ref 0.61–1.24)
GFR, Estimated: 58 mL/min — ABNORMAL LOW (ref >60)
Glucose, Bld: 98 mg/dL (ref 70–99)
Potassium: 4.1 mmol/L (ref 3.5–5.1)
Sodium: 136 mmol/L (ref 135–145)
Total Bilirubin: 0.8 mg/dL (ref 0.3–1.2)
Total Protein: 6.4 g/dL — ABNORMAL LOW (ref 6.5–8.1)

## 2020-10-11 LAB — PSA: Prostatic Specific Antigen: 3.39 ng/mL (ref 0.00–4.00)

## 2020-10-11 NOTE — Patient Instructions (Signed)
[place] at [facility] Discharge Instructions  You were seen today by Dr. Delton Coombes. He went over your recent results. Purchase calcium and vitamin D over the counter and take 1,200 mg of calcium and 1,000 units of vitamin D daily. Your Lupron shot will be due in May. Dr. Delton Coombes will see you back in 4 months for labs and follow up.   Thank you for choosing [place] at [facility] to provide your oncology and hematology care.  To afford each patient quality time with our provider, please arrive at least 15 minutes before your scheduled appointment time.   If you have a lab appointment with the [facility] please come in thru the Main Entrance and check in at the main information desk  You need to re-schedule your appointment should you arrive 10 or more minutes late.  We strive to give you quality time with our providers, and arriving late affects you and other patients whose appointments are after yours.  Also, if you no show three or more times for appointments you may be dismissed from the clinic at the providers discretion.     Again, thank you for choosing [facility]  Our hope is that these requests will decrease the amount of time that you wait before being seen by our physicians.       _____________________________________________________________  Should you have questions after your visit to [facility], please contact our office at [phone] between the hours of 8:00 a.m. and 4:30 p.m.  Voicemails left after 4:00 p.m. will not be returned until the following business day.  For prescription refill requests, have your pharmacy contact our office and allow 72 hours.    Cancer Center Support Programs:   > Cancer Support Group  2nd Tuesday of the month 1pm-2pm, Journey Room

## 2020-10-11 NOTE — Progress Notes (Signed)
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  INTERVAL HISTORY:  [name], a 87 y.o. 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 Ryan Walls was last seen on 06/07/2020.   Today he is accompanied by his wife and he reports feeling well. He is taking enzalutamide 160 mg daily and tolerating it well; he notes that his energy levels are gone down slightly. His appetite is excellent and he denies having N/V.  He goes to the gym once to twice per week to work out with a Clinical research associate.   REVIEW OF SYSTEMS:  Review of Systems  Constitutional: Positive for fatigue. Negative for appetite change.  Gastrointestinal: Negative for nausea and vomiting.  All other systems reviewed and are negative.   PAST MEDICAL/SURGICAL HISTORY:  Past Medical History:  Diagnosis Date   Prostate cancer ([place])    No past surgical history on file.  SOCIAL HISTORY:  Social History   Socioeconomic History   Marital status: Married    Spouse name: Not on file   Number of children: Not on file   Years of education: Not on file   Highest education level: Not on file  Occupational History   Not on file  Tobacco Use    Smoking status: Former Smoker    Types: Cigarettes    Quit date: 11/10/1977    Years since quitting: 42.9   Smokeless tobacco: Never Used  Vaping Use   Vaping Use: Never used  Substance and Sexual Activity   Alcohol use: Not Currently   Drug use: Never   Sexual activity: Not Currently  Other Topics Concern   Not on file  Social History Narrative   Not on file   Social Determinants of Health   Financial Resource Strain: Low Risk    Difficulty of Paying Living Expenses: Not hard at all  Food Insecurity: No Food Insecurity   Worried About Charity fundraiser in the Last Year: Never true   [place] in the Last Year: Never true  Transportation Needs: No Transportation Needs   Lack of Transportation (Medical): No   Lack of Transportation (Non-Medical): No  Physical Activity: Insufficiently Active   Days of Exercise per Week: 3 days   Minutes of Exercise per Session: 20 min  Stress: No Stress Concern Present   Feeling of Stress : Not at all  Social Connections: Moderately Integrated   Frequency of Communication with Friends and Family: More than three times a week   Frequency of Social Gatherings with Friends and Family: More than three times a week   Attends Religious Services: 1 to 4 times per year   Active Member of [employer] or Organizations: No   Attends Archivist Meetings: Never  Marital Status: Married  Human resources officer Violence: Not At Risk   Fear of Current or Ex-Partner: No   Emotionally Abused: No   Physically Abused: No   Sexually Abused: No    FAMILY HISTORY:  No family history on file.  CURRENT MEDICATIONS:  Current Outpatient Medications  Medication Sig Dispense Refill   amiodarone (PACERONE) 200 MG tablet Take 200 mg by mouth daily.      bimatoprost (LUMIGAN) 0.01 % SOLN Place 1 drop into both eyes at bedtime.      ciprofloxacin (CILOXAN) 0.3 % ophthalmic solution      ELIQUIS 2.5 MG TABS tablet Take 2.5 mg by  mouth 2 (two) times daily.     furosemide (LASIX) 20 MG tablet Take 20 mg by mouth daily.     metoprolol succinate (TOPROL-XL) 25 MG 24 hr tablet Take 25 mg by mouth every morning.  6   rivaroxaban (XARELTO) 20 MG TABS tablet Take 1 tablet (20 mg total) by mouth daily with supper. 30 tablet 6   Vitamin D, Ergocalciferol, (DRISDOL) 50000 units CAPS capsule Take 50,000 Units by mouth daily.      XTANDI 80 MG tablet TAKE 2 TABLETS BY MOUTH 1 TIME A DAY. 60 tablet 5   No current facility-administered medications for this visit.   Facility-Administered Medications Ordered in Other Visits  Medication Dose Route Frequency Provider Last Rate Last Admin   Leuprolide Acetate (6 Month) (LUPRON) injection 45 mg  45 mg Intramuscular Once Derek Jack, MD        ALLERGIES:  No Known Allergies  PHYSICAL EXAM:  Performance status (ECOG): 1 - Symptomatic but completely ambulatory  Vitals:   10/11/20 1649  BP: (!) 141/66  Pulse: (!) 59  Resp: 16  SpO2: 100%   Wt Readings from Last 3 Encounters:  10/11/20 238 lb 4.8 oz (108.1 kg)  06/07/20 233 lb (105.7 kg)  01/18/20 237 lb 6.4 oz (107.7 kg)   Physical Exam Vitals reviewed.  Constitutional:      Appearance: Normal appearance. He is obese.  Cardiovascular:     Rate and Rhythm: Normal rate and regular rhythm.     Pulses: Normal pulses.     Heart sounds: Normal heart sounds.  Pulmonary:     Effort: Pulmonary effort is normal.     Breath sounds: Normal breath sounds.  Abdominal:     Palpations: Abdomen is soft. There is no mass.     Tenderness: There is no abdominal tenderness.     Hernia: No hernia is present.  Musculoskeletal:     Right lower leg: No edema.     Left lower leg: No edema.  Neurological:     General: No focal deficit present.     Mental Status: He is alert and oriented to person, place, and time.  Psychiatric:        Mood and Affect: Mood normal.        Behavior: Behavior normal.      LABORATORY DATA:   I have reviewed the labs as listed.  CBC Latest Ref Rng & Units 10/11/2020 06/07/2020 01/18/2020  WBC 4.0 - 10.5 K/uL 6.1 4.9 7.7  Hemoglobin 13.0 - 17.0 g/dL 13.5 13.3 12.3(L)  Hematocrit 39.0 - 52.0 % 38.9(L) 38.8(L) 36.7(L)  Platelets 150 - 400 K/uL 183 182 209   CMP Latest Ref Rng & Units 10/11/2020 06/07/2020 01/18/2020  Glucose 70 - 99 mg/dL 98 113(H) 117(H)  BUN 8 - 23 mg/dL '18 16 15  ' Creatinine 0.61 -  1.24 mg/dL 1.23 1.32(H) 1.19  Sodium 135 - 145 mmol/L 136 133(L) 136  Potassium 3.5 - 5.1 mmol/L 4.1 4.0 4.2  Chloride 98 - 111 mmol/L 103 99 100  CO2 22 - 32 mmol/L '24 24 25  ' Calcium 8.9 - 10.3 mg/dL 8.9 9.1 9.2  Total Protein 6.5 - 8.1 g/dL 6.4(L) 6.5 6.3(L)  Total Bilirubin 0.3 - 1.2 mg/dL 0.8 1.2 1.0  Alkaline Phos 38 - 126 U/L 91 102 84  AST 15 - 41 U/L '21 18 20  ' ALT 0 - 44 U/L '14 12 12   ' PSA 3.96 06/07/2020  PSA 3.80 01/18/2020  PSA 4.88 (H) 12/15/2019    DIAGNOSTIC IMAGING:  I have independently reviewed the scans and discussed with the patient. No results found.   ASSESSMENT:  1. 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 -Prostate biopsy on 02/06/2015 consistent with adenocarcinoma, Gleason 4+5=9. -Zytiga and prednisone from 05/05/2016 through 10/11/2019 with progression. -Current 360 showed some obscure mutation with a clinical trial in [place]. -Last Lupron 45 mg on 11/16/2019. -Enzalutamide started on 11/03/2018.   PLAN:  1. Metastatic CRPC to the bones: -He is taking enzalutamide 80 mg 2 tablets daily. -He is having some fatigue which is manageable at this time. -He is going to the gym once a week. -We reviewed labs today which showed normal chemistries and CBC. -PSA improved to 3.39.  Continue enzalutamide at this time.  RTC in 4 months with repeat labs. -He will receive Lupron in first week of May.  2. Bone metastasis: -Start calcium and vitamin D supplements.  3. Atrial  fibrillation: -Continue Xarelto, amiodarone and Toprol-XL.   Orders placed this encounter:  No orders of the defined types were placed in this encounter.    Derek Jack, MD [place] [phone]   I, Milinda Antis, am acting as a scribe for Dr. Sanda Linger.  I, Derek Jack MD, have reviewed the above documentation for accuracy and completeness, and I agree with the above.

## 2020-10-21 ENCOUNTER — Other Ambulatory Visit (HOSPITAL_COMMUNITY): Payer: Self-pay | Admitting: Hematology

## 2020-10-22 ENCOUNTER — Other Ambulatory Visit (HOSPITAL_COMMUNITY): Payer: Self-pay

## 2020-12-05 ENCOUNTER — Ambulatory Visit (HOSPITAL_COMMUNITY): Payer: No Typology Code available for payment source

## 2021-02-18 ENCOUNTER — Other Ambulatory Visit (HOSPITAL_COMMUNITY): Payer: No Typology Code available for payment source

## 2021-02-18 ENCOUNTER — Ambulatory Visit (HOSPITAL_COMMUNITY): Payer: No Typology Code available for payment source | Admitting: Hematology

## 2021-02-18 ENCOUNTER — Ambulatory Visit (HOSPITAL_COMMUNITY): Payer: No Typology Code available for payment source

## 2021-02-25 IMAGING — NM NM BONE WHOLE BODY
2 series · 2 of 2 positions shown · non-contrast
Comparison: None.

CLINICAL DATA: Prostate cancer.

EXAM:
NUCLEAR MEDICINE WHOLE BODY BONE SCAN
TECHNIQUE: Whole body anterior and posterior images were obtained approximately
3 hours after intravenous injection of radiopharmaceutical.
RADIOPHARMACEUTICALS:  21 mCi 9echnetium-BBm MDP IV

[Series 1: whole body · 2.66mm/px · 1 of 1 slices shown (1 of 2)]
[im 1/1]
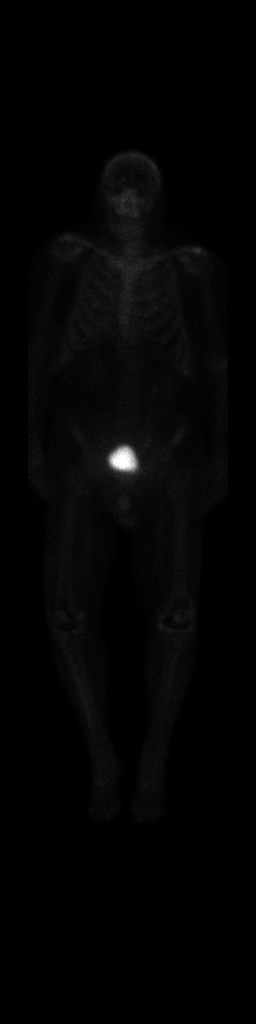

[Series 1: whole body · 2.66mm/px · 1 of 1 slices shown (2 of 2)]
[im 1/1]
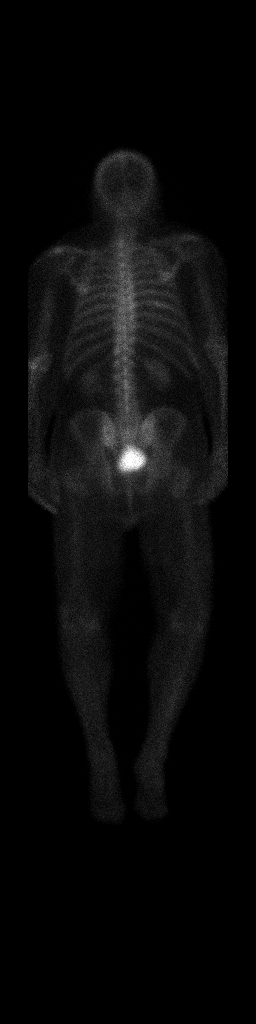

[2 of 2 positions shown; findings below may reference images not displayed]

FINDINGS: There are no foci of increased or decreased radiotracer uptake to
suggest osseous metastatic disease.

Normal physiologic activity is identified within the kidneys and
urinary bladder.
IMPRESSION: Normal bone scan.

## 2021-03-04 ENCOUNTER — Encounter (HOSPITAL_COMMUNITY): Payer: Self-pay

## 2021-03-04 ENCOUNTER — Other Ambulatory Visit (HOSPITAL_COMMUNITY): Payer: Self-pay

## 2021-03-04 MED ORDER — ENZALUTAMIDE 80 MG PO TABS: ORAL_TABLET | ORAL | 5 refills | Status: DC

## 2021-03-04 NOTE — Progress Notes (Signed)
Chart reviewed. Xtandi refilled per last office visit with Dr. Delton Coombes

## 2021-03-15 ENCOUNTER — Encounter (HOSPITAL_COMMUNITY): Payer: Self-pay | Admitting: Hematology

## 2021-03-20 NOTE — Progress Notes (Signed)
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  INTERVAL HISTORY:  Ryan Walls, a 87 y.o. 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 Ryan Walls was last seen on 10/11/20.   Today he reports feeling well. He denies ankle swellings, but he reports occasional stiffness in his knees. He denies any new pains.     REVIEW OF SYSTEMS:  Review of Systems  Constitutional:  Negative for appetite change and fatigue (70%).  Cardiovascular:  Negative for leg swelling.  All other systems reviewed and are negative.  PAST MEDICAL/SURGICAL HISTORY:  Past Medical History:  Diagnosis Date   Prostate cancer ([place])    No past surgical history on file.  SOCIAL HISTORY:  Social History   Socioeconomic History   Marital status: Married    Spouse name: Not on file   Number of children: Not on file   Years of education: Not on file   Highest education level: Not on file  Occupational History   Not on file  Tobacco Use   Smoking status: Former    Types: Cigarettes    Quit date: 11/10/1977    Years since quitting: 43.3   Smokeless tobacco: Never  Vaping Use   Vaping Use: Never used  Substance and Sexual  Activity   Alcohol use: Not Currently   Drug use: Never   Sexual activity: Not Currently  Other Topics Concern   Not on file  Social History Narrative   Not on file   Social Determinants of Health   Financial Resource Strain: Low Risk    Difficulty of Paying Living Expenses: Not hard at all  Food Insecurity: No Food Insecurity   Worried About Charity fundraiser in the Last Year: Never true   [place] in the Last Year: Never true  Transportation Needs: No Transportation Needs   Lack of Transportation (Medical): No   Lack of Transportation (Non-Medical): No  Physical Activity: Insufficiently Active   Days of Exercise per Week: 3 days   Minutes of Exercise per Session: 20 min  Stress: No Stress Concern Present   Feeling of Stress : Not at all  Social Connections: Moderately Integrated   Frequency of Communication with Friends and Family: More than three times a week   Frequency of Social Gatherings with Friends and Family: More than three times a week   Attends Religious Services: 1 to 4 times per year   Active Member of [employer] or Organizations: No   Attends Archivist Meetings: Never   Marital Status: Married  Human resources officer Violence: Not At Risk   Fear of Current or Ex-Partner: No   Emotionally Abused: No   Physically Abused: No   Sexually Abused: No  FAMILY HISTORY:  No family history on file.  CURRENT MEDICATIONS:  Current Outpatient Medications  Medication Sig Dispense Refill   amiodarone (PACERONE) 200 MG tablet Take 200 mg by mouth daily.      bimatoprost (LUMIGAN) 0.01 % SOLN Place 1 drop into both eyes at bedtime.      Bromfenac Sodium (PROLENSA) 0.07 % SOLN Prolensa 0.07 % eye drops     ciprofloxacin (CILOXAN) 0.3 % ophthalmic solution      ELIQUIS 2.5 MG TABS tablet Take 2.5 mg by mouth 2 (two) times daily.     enzalutamide (XTANDI) 80 MG tablet TAKE 2 TABLETS BY MOUTH 1 TIME A DAY. 60 tablet 5   furosemide (LASIX) 20 MG tablet Take 20 mg  by mouth daily.     metoprolol succinate (TOPROL-XL) 25 MG 24 hr tablet Take 25 mg by mouth every morning.  6   prednisoLONE acetate (PRED FORTE) 1 % ophthalmic suspension instill 1 drop by ophthalmic route 4 times every day into OS, BEGIN GTTS POST OP     predniSONE (DELTASONE) 5 MG tablet TAKE 2 TABLETS BY MOUTH EVERY DAY 60 tablet 12   Rivaroxaban (XARELTO) 15 MG TABS tablet Xarelto 15 mg tablet  TAKE 1 TABLET BY MOUTH EVERY DAY     Vitamin D, Ergocalciferol, (DRISDOL) 50000 units CAPS capsule Take 50,000 Units by mouth daily.      No current facility-administered medications for this visit.   Facility-Administered Medications Ordered in Other Visits  Medication Dose Route Frequency Provider Last Rate Last Admin   Leuprolide Acetate (6 Month) (LUPRON) injection 45 mg  45 mg Intramuscular Once Derek Jack, MD        ALLERGIES:  No Known Allergies  PHYSICAL EXAM:  Performance status (ECOG): 1 - Symptomatic but completely ambulatory  There were no vitals filed for this visit. Wt Readings from Last 3 Encounters:  10/11/20 238 lb 4.8 oz (108.1 kg)  06/07/20 233 lb (105.7 kg)  01/18/20 237 lb 6.4 oz (107.7 kg)   Physical Exam Vitals reviewed.  Constitutional:      Appearance: Normal appearance.  Cardiovascular:     Rate and Rhythm: Normal rate and regular rhythm.     Pulses: Normal pulses.     Heart sounds: Normal heart sounds.  Pulmonary:     Effort: Pulmonary effort is normal.     Breath sounds: Normal breath sounds.  Neurological:     General: No focal deficit present.     Mental Status: He is alert and oriented to person, place, and time.  Psychiatric:        Mood and Affect: Mood normal.        Behavior: Behavior normal.     LABORATORY DATA:  I have reviewed the labs as listed.  CBC Latest Ref Rng & Units 10/11/2020 06/07/2020 01/18/2020  WBC 4.0 - 10.5 K/uL 6.1 4.9 7.7  Hemoglobin 13.0 - 17.0 g/dL 13.5 13.3 12.3(L)  Hematocrit 39.0 - 52.0 % 38.9(L) 38.8(L)  36.7(L)  Platelets 150 - 400 K/uL 183 182 209   CMP Latest Ref Rng & Units 10/11/2020 06/07/2020 01/18/2020  Glucose 70 - 99 mg/dL 98 113(H) 117(H)  BUN 8 - 23 mg/dL _0 Creatinine 0.61 - 1.24 mg/dL 1.23 1.32(H) 1.19  Sodium 135 - 145 mmol/L 136 133(L) 136  Potassium 3.5 - 5.1 mmol/L 4.1 4.0 4.2  Chloride 98 - 111 mmol/L 103 99 100  CO2 22 - 32 mmol/L _1 Calcium 8.9 -  10.3 mg/dL 8.9 9.1 9.2  Total Protein 6.5 - 8.1 g/dL 6.4(L) 6.5 6.3(L)  Total Bilirubin 0.3 - 1.2 mg/dL 0.8 1.2 1.0  Alkaline Phos 38 - 126 U/L 91 102 84  AST 15 - 41 U/L _0 ALT 0 - 44 U/L _1 DIAGNOSTIC IMAGING:  I have independently reviewed the scans and discussed with the patient. No results found.   ASSESSMENT:  1.  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 -Prostate biopsy on 02/06/2015 consistent with adenocarcinoma, Gleason 4+5 =9. -Zytiga and prednisone from 05/05/2016 through 10/11/2019 with progression. -Current 360 showed some obscure mutation with a clinical trial in [place]. -Last Lupron 45 mg on 11/16/2019. -Enzalutamide started on 11/03/2018.     PLAN:  1.  Metastatic CRPC to the bones: - Continue enzalutamide 80 mg 2 tablets daily. - Slight fatigue which is manageable at this time.  Encouraged going to the gym once a week. - He missed his Lupron injection in May.  We will give it today at 45 mg. - His PSA today has increased to 5.03, from 3.39 in March.  CBC and LFTs are within normal limits. - Rising PSA likely from missing Lupron injection. - RTC 3 months for follow-up with repeat labs.   2.  Bone metastasis: - Continue calcium and vitamin D supplements.   3.  Atrial fibrillation: - Continue Xarelto, amiodarone and Toprol-XL.   Orders placed this encounter:  No orders of the defined types were placed in this encounter.    Derek Jack, MD [place] [phone]   I,  Thana Ates, am acting as a scribe for Dr. Derek Jack.  I, Derek Jack MD, have reviewed the above documentation for accuracy and completeness, and I agree with the above.

## 2021-03-21 ENCOUNTER — Other Ambulatory Visit: Payer: Self-pay

## 2021-03-21 ENCOUNTER — Inpatient Hospital Stay (HOSPITAL_BASED_OUTPATIENT_CLINIC_OR_DEPARTMENT_OTHER): Payer: Medicare Other | Admitting: Hematology

## 2021-03-21 ENCOUNTER — Inpatient Hospital Stay (HOSPITAL_COMMUNITY): Payer: Medicare Other

## 2021-03-21 ENCOUNTER — Inpatient Hospital Stay (HOSPITAL_COMMUNITY): Payer: Medicare Other | Attending: Hematology

## 2021-03-21 VITALS — BP 141/76 | HR 75 | Temp 96.9°F | Resp 20 | Wt 237.7 lb

## 2021-03-21 DIAGNOSIS — Z79899 Other long term (current) drug therapy: Secondary | ICD-10-CM | POA: Insufficient documentation

## 2021-03-21 DIAGNOSIS — C7951 Secondary malignant neoplasm of bone: Secondary | ICD-10-CM | POA: Diagnosis not present

## 2021-03-21 DIAGNOSIS — C61 Malignant neoplasm of prostate: Secondary | ICD-10-CM

## 2021-03-21 DIAGNOSIS — Z79818 Long term (current) use of other agents affecting estrogen receptors and estrogen levels: Secondary | ICD-10-CM | POA: Insufficient documentation

## 2021-03-21 DIAGNOSIS — I4891 Unspecified atrial fibrillation: Secondary | ICD-10-CM | POA: Insufficient documentation

## 2021-03-21 DIAGNOSIS — Z7901 Long term (current) use of anticoagulants: Secondary | ICD-10-CM | POA: Diagnosis not present

## 2021-03-21 LAB — COMPREHENSIVE METABOLIC PANEL
ALT: 14 U/L (ref 0–44)
AST: 22 U/L (ref 15–41)
Albumin: 4.2 g/dL (ref 3.5–5.0)
Alkaline Phosphatase: 85 U/L (ref 38–126)
Anion gap: 10 (ref 5–15)
BUN: 18 mg/dL (ref 8–23)
CO2: 21 mmol/L — ABNORMAL LOW (ref 22–32)
Calcium: 8.6 mg/dL — ABNORMAL LOW (ref 8.9–10.3)
Chloride: 103 mmol/L (ref 98–111)
Creatinine, Ser: 1.05 mg/dL (ref 0.61–1.24)
GFR, Estimated: >60 mL/min (ref >60)
Glucose, Bld: 101 mg/dL — ABNORMAL HIGH (ref 70–99)
Potassium: 4.5 mmol/L (ref 3.5–5.1)
Sodium: 134 mmol/L — ABNORMAL LOW (ref 135–145)
Total Bilirubin: 0.9 mg/dL (ref 0.3–1.2)
Total Protein: 6.4 g/dL — ABNORMAL LOW (ref 6.5–8.1)

## 2021-03-21 LAB — CBC WITH DIFFERENTIAL/PLATELET
Abs Immature Granulocytes: 0.02 10*3/uL (ref 0.00–0.07)
Basophils Absolute: 0 10*3/uL (ref 0.0–0.1)
Basophils Relative: 1 %
Eosinophils Absolute: 0.2 10*3/uL (ref 0.0–0.5)
Eosinophils Relative: 4 %
HCT: 38.8 % — ABNORMAL LOW (ref 39.0–52.0)
Hemoglobin: 13.4 g/dL (ref 13.0–17.0)
Immature Granulocytes: 0 %
Lymphocytes Relative: 17 %
Lymphs Abs: 0.9 10*3/uL (ref 0.7–4.0)
MCH: 31.2 pg (ref 26.0–34.0)
MCHC: 34.5 g/dL (ref 30.0–36.0)
MCV: 90.4 fL (ref 80.0–100.0)
Monocytes Absolute: 0.6 10*3/uL (ref 0.1–1.0)
Monocytes Relative: 10 %
Neutro Abs: 3.7 10*3/uL (ref 1.7–7.7)
Neutrophils Relative %: 68 %
Platelets: 180 10*3/uL (ref 150–400)
RBC: 4.29 MIL/uL (ref 4.22–5.81)
RDW: 13.9 % (ref 11.5–15.5)
WBC: 5.4 10*3/uL (ref 4.0–10.5)
nRBC: 0 % (ref 0.0–0.2)

## 2021-03-21 LAB — PSA: Prostatic Specific Antigen: 5.03 ng/mL — ABNORMAL HIGH (ref 0.00–4.00)

## 2021-03-21 MED ORDER — LEUPROLIDE ACETATE (6 MONTH) 45 MG ~~LOC~~ KIT
45.0000 mg | PACK | Freq: Once | SUBCUTANEOUS | Status: AC
  Administered 2021-03-21: 45 mg via SUBCUTANEOUS
  Filled 2021-03-21: qty 45

## 2021-03-21 NOTE — Patient Instructions (Addendum)
[place] at [facility] Discharge Instructions  You were seen today by Dr. Delton Coombes. He went over your recent results, and you received your injection. Dr. Delton Coombes will see you back in 3 months for labs and follow up.   Thank you for choosing [place] at [facility] to provide your oncology and hematology care.  To afford each patient quality time with our provider, please arrive at least 15 minutes before your scheduled appointment time.   If you have a lab appointment with the [facility] please come in thru the Main Entrance and check in at the main information desk  You need to re-schedule your appointment should you arrive 10 or more minutes late.  We strive to give you quality time with our providers, and arriving late affects you and other patients whose appointments are after yours.  Also, if you no show three or more times for appointments you may be dismissed from the clinic at the providers discretion.     Again, thank you for choosing [facility].  Our hope is that these requests will decrease the amount of time that you wait before being seen by our physicians.       _____________________________________________________________  Should you have questions after your visit to [facility], please contact our office at [phone] between the hours of 8:00 a.m. and 4:30 p.m.  Voicemails left after 4:00 p.m. will not be returned until the following business day.  For prescription refill requests, have your pharmacy contact our office and allow 72 hours.    Cancer Center Support Programs:   > Cancer Support Group  2nd Tuesday of the month 1pm-2pm, Journey Room

## 2021-03-21 NOTE — Progress Notes (Signed)
Ryan Walls presents today for injection per the provider's orders.  Eligard administration without incident; injection site WNL; see MAR for injection details.  Patient tolerated procedure well and without incident. Patient remained stable through entire visit. No questions or complaints noted at this time. Patient discharged ambulatory in stable condition.

## 2021-03-21 NOTE — Progress Notes (Signed)
Patient seen and examined today by Dr. Delton Coombes. Labs reviewed by Dr. Delton Coombes. Okay to proceed with Lupron injection per Dr. Delton Coombes.

## 2021-03-21 NOTE — Patient Instructions (Signed)
[facility]  Discharge Instructions: Thank you for choosing [facility] to provide your oncology and hematology care.  If you have a lab appointment with the [facility], please come in thru the Main Entrance and check in at the main information desk.  Wear comfortable clothing and clothing appropriate for easy access to any Portacath or PICC line.   We strive to give you quality time with your provider. You may need to reschedule your appointment if you arrive late (15 or more minutes).  Arriving late affects you and other patients whose appointments are after yours.  Also, if you miss three or more appointments without notifying the office, you may be dismissed from the clinic at the provider's discretion.      For prescription refill requests, have your pharmacy contact our office and allow 72 hours for refills to be completed.    Today you received the following chemotherapy and/or immunotherapy agents Eligard injection.      To help prevent nausea and vomiting after your treatment, we encourage you to take your nausea medication as directed.  BELOW ARE SYMPTOMS THAT SHOULD BE REPORTED IMMEDIATELY: *FEVER GREATER THAN 100.4 F (38 C) OR HIGHER *CHILLS OR SWEATING *NAUSEA AND VOMITING THAT IS NOT CONTROLLED WITH YOUR NAUSEA MEDICATION *UNUSUAL SHORTNESS OF BREATH *UNUSUAL BRUISING OR BLEEDING *URINARY PROBLEMS (pain or burning when urinating, or frequent urination) *BOWEL PROBLEMS (unusual diarrhea, constipation, pain near the anus) TENDERNESS IN MOUTH AND THROAT WITH OR WITHOUT PRESENCE OF ULCERS (sore throat, sores in mouth, or a toothache) UNUSUAL RASH, SWELLING OR PAIN  UNUSUAL VAGINAL DISCHARGE OR ITCHING   Items with * indicate a potential emergency and should be followed up as soon as possible or go to the Emergency Department if any problems should occur.  Please show the CHEMOTHERAPY ALERT CARD or IMMUNOTHERAPY ALERT CARD at check-in to the  Emergency Department and triage nurse.  Should you have questions after your visit or need to cancel or reschedule your appointment, please contact [facility] [phone]  and follow the prompts.  Office hours are 8:00 a.m. to 4:30 p.m. Monday - Friday. Please note that voicemails left after 4:00 p.m. may not be returned until the following business day.  We are closed weekends and major holidays. You have access to a nurse at all times for urgent questions. Please call the main number to the clinic [phone] and follow the prompts.  For any non-urgent questions, you may also contact your provider using MyChart. We now offer e-Visits for anyone 31 and older to request care online for non-urgent symptoms. For details visit [email or web address].   Also download the MyChart app! Go to the app store, search "MyChart", open the app, select [place], and log in with your MyChart username and password.  Due to Covid, a mask is required upon entering the hospital/clinic. If you do not have a mask, one will be given to you upon arrival. For doctor visits, patients may have 1 support person aged 13 or older with them. For treatment visits, patients cannot have anyone with them due to current Covid guidelines and our immunocompromised population.

## 2021-03-24 ENCOUNTER — Encounter (HOSPITAL_COMMUNITY): Payer: Self-pay | Admitting: Hematology

## 2021-03-25 ENCOUNTER — Other Ambulatory Visit (HOSPITAL_COMMUNITY): Payer: Self-pay

## 2021-03-25 ENCOUNTER — Other Ambulatory Visit (HOSPITAL_COMMUNITY): Payer: Self-pay | Admitting: Hematology

## 2021-03-25 ENCOUNTER — Encounter (HOSPITAL_COMMUNITY): Payer: Self-pay | Admitting: Hematology

## 2021-05-28 ENCOUNTER — Telehealth (HOSPITAL_COMMUNITY): Payer: Self-pay | Admitting: Pharmacy Technician

## 2021-05-28 NOTE — Telephone Encounter (Signed)
Oral Oncology Patient Advocate Encounter  Received communication from [employer] that the patient's eligibility in the patient assistance program is due for re-enrollment for 2023.  The application has been completed and faxed in an effort to keep the patient's out of pocket expense for Xtandi at $0.     Application completed and faxed to [phone].    [facility] phone number for follow up is [phone].    This encounter will be updated until final determination.  [facility] Patient [place] Phone [phone] Fax [phone] 05/28/2021 4:01 PM

## 2021-05-29 ENCOUNTER — Other Ambulatory Visit (HOSPITAL_COMMUNITY): Payer: Self-pay

## 2021-07-02 NOTE — Progress Notes (Addendum)
[place], [place] [address]   CLINIC:  Medical Oncology/Hematology  PCP:  Olena Mater, MD [address] 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  INTERVAL HISTORY:  Ryan Walls, a 87 y.o. 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 Ryan Walls was last seen on 03/21/2021.   Today he reports feeling good, and he is accompanied by his wife. He is taking Xtandi and tolerating it well. His wife has noticed his energy levels have decreased.   REVIEW OF SYSTEMS:  Review of Systems  Constitutional:  Positive for fatigue (75%). Negative for appetite change.  All other systems reviewed and are negative.  PAST MEDICAL/SURGICAL HISTORY:  Past Medical History:  Diagnosis Date   Prostate cancer ([place])    No past surgical history on file.  SOCIAL HISTORY:  Social History   Socioeconomic History   Marital status: Married    Spouse name: Not on file   Number of children: Not on file   Years of education: Not on file   Highest education level: Not on file  Occupational History   Not on file  Tobacco Use   Smoking status: Former    Types: Cigarettes    Quit date: 11/10/1977    Years since quitting: 43.6   Smokeless tobacco: Never  Vaping Use   Vaping Use: Never used  Substance and Sexual  Activity   Alcohol use: Not Currently   Drug use: Never   Sexual activity: Not Currently  Other Topics Concern   Not on file  Social History Narrative   Not on file   Social Determinants of Health   Financial Resource Strain: Not on file  Food Insecurity: Not on file  Transportation Needs: Not on file  Physical Activity: Not on file  Stress: Not on file  Social Connections: Not on file  Intimate Partner Violence: Not on file    FAMILY HISTORY:  No family history on file.  CURRENT MEDICATIONS:  Current Outpatient Medications  Medication Sig Dispense Refill   amiodarone (PACERONE) 200 MG tablet Take 200 mg by mouth daily.      bimatoprost (LUMIGAN) 0.01 % SOLN Place 1 drop into both eyes at bedtime.      bimatoprost (LUMIGAN) 0.01 % SOLN 1 drop into affected eye in the evening     Bromfenac Sodium (PROLENSA) 0.07 % SOLN Prolensa 0.07 % eye drops     Cholecalciferol (VITAMIN D-3) 25 MCG (1000 UT) CAPS 1 capsule     Cholecalciferol 25 MCG (1000 UT) tablet Take 1 tablet by mouth daily.     ciprofloxacin (CILOXAN) 0.3 % ophthalmic solution      ELIQUIS 2.5 MG TABS tablet Take 2.5 mg by mouth 2 (two) times daily.     erythromycin ophthalmic ointment erythromycin 5 mg/gram (0.5 %) eye ointment  PLEASE SEE ATTACHED FOR DETAILED  DIRECTIONS     erythromycin ophthalmic ointment PLEASE SEE ATTACHED FOR DETAILED DIRECTIONS     furosemide (LASIX) 20 MG tablet Take 20 mg by mouth daily.     HYDROcodone-acetaminophen (NORCO/VICODIN) 5-325 MG tablet hydrocodone 5 mg-acetaminophen 325 mg tablet  TAKE 1 TABLET BY MOUTH EVERY 4 HOURS AS NEEDED FOR ABDOMINAL CRAMPS OR ABDOMINAL PAIN     metoprolol succinate (TOPROL-XL) 25 MG 24 hr tablet Take 25 mg by mouth every morning.  6   metoprolol tartrate (LOPRESSOR) 25 MG tablet 1 tablet     Multiple Vitamin (MULTI VITAMIN) TABS 1 tablet     prednisoLONE acetate (PRED FORTE) 1 % ophthalmic suspension instill 1 drop by ophthalmic route 4 times every day  into OS, BEGIN GTTS POST OP     predniSONE (DELTASONE) 5 MG tablet TAKE 2 TABLETS BY MOUTH EVERY DAY 60 tablet 12   Rivaroxaban (XARELTO) 15 MG TABS tablet Xarelto 15 mg tablet  TAKE 1 TABLET BY MOUTH EVERY DAY     Vitamin D, Ergocalciferol, (DRISDOL) 50000 units CAPS capsule Take 50,000 Units by mouth daily.      XTANDI 80 MG tablet Take 2 tablets (160 mg) by mouth once daily as directed by physician. 60 tablet 4   No current facility-administered medications for this visit.   Facility-Administered Medications Ordered in Other Visits  Medication Dose Route Frequency Provider Last Rate Last Admin   Leuprolide Acetate (6 Month) (LUPRON) injection 45 mg  45 mg Intramuscular Once Derek Jack, MD        ALLERGIES:  Allergies  Allergen Reactions   No Known Allergies     PHYSICAL EXAM:  Performance status (ECOG): 1 - Symptomatic but completely ambulatory  Vitals:   07/03/21 1526  BP: 138/74  Pulse: 78  Resp: 18  Temp: 98.2 F (36.8 C)  SpO2: 98%   Wt Readings from Last 3 Encounters:  07/03/21 242 lb (109.8 kg)  03/21/21 237 lb 10.5 oz (107.8 kg)  10/11/20 238 lb 4.8 oz (108.1 kg)   Physical Exam Vitals reviewed.  Constitutional:      Appearance: Normal appearance.  Cardiovascular:     Rate and Rhythm: Normal rate and regular rhythm.     Pulses: Normal pulses.     Heart sounds: Normal heart sounds.  Pulmonary:     Effort: Pulmonary effort is normal.     Breath sounds: Normal breath sounds.  Neurological:     General: No focal deficit present.     Mental Status: He is alert and oriented to person, place, and time.  Psychiatric:        Mood and Affect: Mood normal.        Behavior: Behavior normal.     LABORATORY DATA:  I have reviewed the labs as listed.  CBC Latest Ref Rng & Units 07/03/2021 03/21/2021 10/11/2020  WBC 4.0 - 10.5 K/uL 6.6 5.4 6.1  Hemoglobin 13.0 - 17.0 g/dL 13.6 13.4 13.5  Hematocrit 39.0 - 52.0 % 39.6 38.8(L) 38.9(L)  Platelets 150 - 400  K/uL 189 180 183   CMP Latest Ref Rng & Units 07/03/2021 03/21/2021 10/11/2020  Glucose 70 - 99 mg/dL 116(H) 101(H) 98  BUN 8 - 23 mg/dL '19 18 18  ' Creatinine 0.61 - 1.24 mg/dL 1.24 1.05 1.23  Sodium 135 - 145 mmol/L 136 134(L) 136  Potassium 3.5 - 5.1 mmol/L 4.8 4.5 4.1  Chloride 98 - 111 mmol/L 104 103 103  CO2 22 - 32 mmol/L 25 21(L) 24  Calcium 8.9 - 10.3 mg/dL 9.2 8.6(L) 8.9  Total Protein 6.5 - 8.1 g/dL 6.5 6.4(L) 6.4(L)  Total Bilirubin 0.3 - 1.2 mg/dL 1.0 0.9 0.8  Alkaline Phos 38 - 126 U/L 97 85 91  AST 15 - 41 U/L '23 22 21  ' ALT 0 - 44 U/L '15 14 14    ' DIAGNOSTIC IMAGING:  I have independently reviewed the scans and discussed with the patient. No results found.   ASSESSMENT:  1.  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 -Prostate biopsy on 02/06/2015 consistent with adenocarcinoma, Gleason 4+5 =9. -Zytiga and prednisone from 05/05/2016 through 10/11/2019 with progression. -Current 360 showed some obscure mutation with a clinical trial in [place]. -Last Lupron 45 mg on 11/16/2019. -Enzalutamide started on 11/03/2018.   PLAN:  1.  Metastatic CRPC to the bones: - He is taking enzalutamide 160 mg daily. - He started going back to the gym. - However his wife reports that he is still fatigued.  He reports energy levels are 75%. - I have recommended trying American ginseng for fatigue. - Last Lupron was on 03/21/2021.  His PSA at that time was 5.03, up from 3, likely from delaying Lupron.  PSA level from today is pending. - Reviewed labs from today which shows normal LFTs and CBC. - PSA today came back elevated at 11.04. - Will do PSMA PET CT scan as well as germline mutation testing to increase options for further management.   2.  Bone metastasis: - Continue calcium and vitamin D supplements.   3.  Atrial fibrillation: - Continue Xarelto, amiodarone and Toprol-XL.   Orders placed this encounter:  No orders  of the defined types were placed in this encounter.    Derek Jack, MD [place] [phone]   I, Thana Ates, am acting as a scribe for Dr. Derek Jack.  I, Derek Jack MD, have reviewed the above documentation for accuracy and completeness, and I agree with the above.

## 2021-07-03 ENCOUNTER — Inpatient Hospital Stay (HOSPITAL_BASED_OUTPATIENT_CLINIC_OR_DEPARTMENT_OTHER): Payer: Medicare Other | Admitting: Hematology

## 2021-07-03 ENCOUNTER — Other Ambulatory Visit: Payer: Self-pay

## 2021-07-03 ENCOUNTER — Ambulatory Visit (HOSPITAL_COMMUNITY): Payer: Medicare Other | Admitting: Hematology

## 2021-07-03 ENCOUNTER — Inpatient Hospital Stay (HOSPITAL_COMMUNITY): Payer: Medicare Other

## 2021-07-03 ENCOUNTER — Inpatient Hospital Stay (HOSPITAL_COMMUNITY): Payer: Medicare Other | Attending: Hematology

## 2021-07-03 VITALS — BP 138/74 | HR 78 | Temp 98.2°F | Resp 18 | Wt 242.0 lb

## 2021-07-03 DIAGNOSIS — I4891 Unspecified atrial fibrillation: Secondary | ICD-10-CM | POA: Insufficient documentation

## 2021-07-03 DIAGNOSIS — C7951 Secondary malignant neoplasm of bone: Secondary | ICD-10-CM | POA: Insufficient documentation

## 2021-07-03 DIAGNOSIS — C61 Malignant neoplasm of prostate: Secondary | ICD-10-CM | POA: Insufficient documentation

## 2021-07-03 LAB — CBC WITH DIFFERENTIAL/PLATELET
Abs Immature Granulocytes: 0.02 10*3/uL (ref 0.00–0.07)
Basophils Absolute: 0 10*3/uL (ref 0.0–0.1)
Basophils Relative: 1 %
Eosinophils Absolute: 0.2 10*3/uL (ref 0.0–0.5)
Eosinophils Relative: 3 %
HCT: 39.6 % (ref 39.0–52.0)
Hemoglobin: 13.6 g/dL (ref 13.0–17.0)
Immature Granulocytes: 0 %
Lymphocytes Relative: 10 %
Lymphs Abs: 0.6 10*3/uL — ABNORMAL LOW (ref 0.7–4.0)
MCH: 30 pg (ref 26.0–34.0)
MCHC: 34.3 g/dL (ref 30.0–36.0)
MCV: 87.4 fL (ref 80.0–100.0)
Monocytes Absolute: 0.4 10*3/uL (ref 0.1–1.0)
Monocytes Relative: 6 %
Neutro Abs: 5.4 10*3/uL (ref 1.7–7.7)
Neutrophils Relative %: 80 %
Platelets: 189 10*3/uL (ref 150–400)
RBC: 4.53 MIL/uL (ref 4.22–5.81)
RDW: 13.6 % (ref 11.5–15.5)
WBC: 6.6 10*3/uL (ref 4.0–10.5)
nRBC: 0 % (ref 0.0–0.2)

## 2021-07-03 LAB — COMPREHENSIVE METABOLIC PANEL
ALT: 15 U/L (ref 0–44)
AST: 23 U/L (ref 15–41)
Albumin: 4.3 g/dL (ref 3.5–5.0)
Alkaline Phosphatase: 97 U/L (ref 38–126)
Anion gap: 7 (ref 5–15)
BUN: 19 mg/dL (ref 8–23)
CO2: 25 mmol/L (ref 22–32)
Calcium: 9.2 mg/dL (ref 8.9–10.3)
Chloride: 104 mmol/L (ref 98–111)
Creatinine, Ser: 1.24 mg/dL (ref 0.61–1.24)
GFR, Estimated: 57 mL/min — ABNORMAL LOW (ref >60)
Glucose, Bld: 116 mg/dL — ABNORMAL HIGH (ref 70–99)
Potassium: 4.8 mmol/L (ref 3.5–5.1)
Sodium: 136 mmol/L (ref 135–145)
Total Bilirubin: 1 mg/dL (ref 0.3–1.2)
Total Protein: 6.5 g/dL (ref 6.5–8.1)

## 2021-07-03 LAB — PSA: Prostatic Specific Antigen: 11.04 ng/mL — ABNORMAL HIGH (ref 0.00–4.00)

## 2021-07-03 NOTE — Patient Instructions (Addendum)
[place] at [facility] Discharge Instructions  You were seen and examined today by Dr. Delton Coombes. He reviewed your most recent labs and everything looks good. Continue taking the [facility] as prescribed. Dr. Delton Coombes recommends trying Ginseng to help with your energy levels. Please keep follow up as scheduled in February 2023.    Thank you for choosing [place] at [facility] to provide your oncology and hematology care.  To afford each patient quality time with our provider, please arrive at least 15 minutes before your scheduled appointment time.   If you have a lab appointment with the [facility] please come in thru the Main Entrance and check in at the main information desk.  You need to re-schedule your appointment should you arrive 10 or more minutes late.  We strive to give you quality time with our providers, and arriving late affects you and other patients whose appointments are after yours.  Also, if you no show three or more times for appointments you may be dismissed from the clinic at the providers discretion.     Again, thank you for choosing [facility].  Our hope is that these requests will decrease the amount of time that you wait before being seen by our physicians.       _____________________________________________________________  Should you have questions after your visit to [facility], please contact our office at [phone] and follow the prompts.  Our office hours are 8:00 a.m. and 4:30 p.m. Monday - Friday.  Please note that voicemails left after 4:00 p.m. may not be returned until the following business day.  We are closed weekends and major holidays.  You do have access to a nurse 24-7, just call the main number to the clinic [phone] and do not press any options, hold on the line and a nurse will answer the phone.    For prescription refill requests, have your pharmacy contact our  office and allow 72 hours.    Due to Covid, you will need to wear a mask upon entering the hospital. If you do not have a mask, a mask will be given to you at the Main Entrance upon arrival. For doctor visits, patients may have 1 support person age 77 or older with them. For treatment visits, patients can not have anyone with them due to social distancing guidelines and our immunocompromised population.

## 2021-07-05 ENCOUNTER — Other Ambulatory Visit (HOSPITAL_COMMUNITY): Payer: Self-pay

## 2021-07-05 ENCOUNTER — Other Ambulatory Visit (HOSPITAL_COMMUNITY): Payer: Self-pay | Admitting: Hematology

## 2021-07-05 DIAGNOSIS — C7951 Secondary malignant neoplasm of bone: Secondary | ICD-10-CM

## 2021-07-05 DIAGNOSIS — C61 Malignant neoplasm of prostate: Secondary | ICD-10-CM

## 2021-07-05 NOTE — Progress Notes (Signed)
Order placed for PSMA PET scan and genetics referral per Dr. Delton Coombes

## 2021-07-08 NOTE — Telephone Encounter (Signed)
Oral Oncology Patient Advocate Encounter  Received notification from [facility] that patient has been successfully enrolled into their program to receive Xtandi from the manufacturer at $0 out of pocket until 08/03/22.    I called and spoke with patient.  He knows we will have to re-apply.   Specialty Pharmacy that will dispense medication is [facility].  Patient knows to call the office with questions or concerns.   Oral Oncology Clinic will continue to follow.  [place] Patient [place] Phone [phone] Fax [phone] 07/08/2021 8:36 AM

## 2021-07-23 ENCOUNTER — Ambulatory Visit (HOSPITAL_COMMUNITY): Payer: Medicare Other

## 2021-07-23 ENCOUNTER — Other Ambulatory Visit (HOSPITAL_COMMUNITY): Payer: Self-pay | Admitting: Hematology

## 2021-07-23 ENCOUNTER — Encounter: Payer: Medicare Other | Admitting: Genetic Counselor

## 2021-07-23 MED ORDER — OXYCODONE-ACETAMINOPHEN 5-325 MG PO TABS: 1.0000 | ORAL_TABLET | Freq: Three times a day (TID) | ORAL | 0 refills | Status: AC | PRN

## 2021-08-29 ENCOUNTER — Encounter (HOSPITAL_COMMUNITY)
Admission: RE | Admit: 2021-08-29 | Discharge: 2021-08-29 | Disposition: A | Discharge status: Home / Self Care | Payer: Medicare Other | Source: Ambulatory Visit | Attending: Hematology | Admitting: Hematology

## 2021-08-29 ENCOUNTER — Other Ambulatory Visit: Payer: Self-pay

## 2021-08-29 DIAGNOSIS — C61 Malignant neoplasm of prostate: Secondary | ICD-10-CM | POA: Insufficient documentation

## 2021-08-29 DIAGNOSIS — C7951 Secondary malignant neoplasm of bone: Secondary | ICD-10-CM | POA: Insufficient documentation

## 2021-08-29 MED ORDER — PIFLIFOLASTAT F 18 (PYLARIFY) INJECTION
9.0000 | Freq: Once | INTRAVENOUS | Status: AC
  Administered 2021-08-29: 8.6 via INTRAVENOUS

## 2021-09-05 ENCOUNTER — Inpatient Hospital Stay (HOSPITAL_BASED_OUTPATIENT_CLINIC_OR_DEPARTMENT_OTHER): Payer: Medicare Other | Admitting: Hematology

## 2021-09-05 ENCOUNTER — Other Ambulatory Visit: Payer: Self-pay

## 2021-09-05 ENCOUNTER — Inpatient Hospital Stay (HOSPITAL_COMMUNITY): Payer: Medicare Other | Attending: Hematology

## 2021-09-05 ENCOUNTER — Inpatient Hospital Stay (HOSPITAL_COMMUNITY): Payer: Medicare Other

## 2021-09-05 VITALS — BP 151/84 | HR 67 | Temp 98.2°F | Resp 19 | Wt 242.4 lb

## 2021-09-05 DIAGNOSIS — C61 Malignant neoplasm of prostate: Secondary | ICD-10-CM | POA: Diagnosis present

## 2021-09-05 DIAGNOSIS — Z5111 Encounter for antineoplastic chemotherapy: Secondary | ICD-10-CM | POA: Insufficient documentation

## 2021-09-05 DIAGNOSIS — I700 Atherosclerosis of aorta: Secondary | ICD-10-CM | POA: Insufficient documentation

## 2021-09-05 DIAGNOSIS — Z191 Hormone sensitive malignancy status: Secondary | ICD-10-CM | POA: Insufficient documentation

## 2021-09-05 DIAGNOSIS — Z79818 Long term (current) use of other agents affecting estrogen receptors and estrogen levels: Secondary | ICD-10-CM | POA: Insufficient documentation

## 2021-09-05 DIAGNOSIS — Z7901 Long term (current) use of anticoagulants: Secondary | ICD-10-CM | POA: Insufficient documentation

## 2021-09-05 DIAGNOSIS — C7951 Secondary malignant neoplasm of bone: Secondary | ICD-10-CM

## 2021-09-05 DIAGNOSIS — R911 Solitary pulmonary nodule: Secondary | ICD-10-CM | POA: Insufficient documentation

## 2021-09-05 DIAGNOSIS — Z87891 Personal history of nicotine dependence: Secondary | ICD-10-CM | POA: Diagnosis not present

## 2021-09-05 DIAGNOSIS — I2510 Atherosclerotic heart disease of native coronary artery without angina pectoris: Secondary | ICD-10-CM | POA: Diagnosis not present

## 2021-09-05 DIAGNOSIS — I4891 Unspecified atrial fibrillation: Secondary | ICD-10-CM | POA: Insufficient documentation

## 2021-09-05 LAB — CBC WITH DIFFERENTIAL/PLATELET
Abs Immature Granulocytes: 0.04 10*3/uL (ref 0.00–0.07)
Basophils Absolute: 0 10*3/uL (ref 0.0–0.1)
Basophils Relative: 1 %
Eosinophils Absolute: 0.1 10*3/uL (ref 0.0–0.5)
Eosinophils Relative: 2 %
HCT: 38.1 % — ABNORMAL LOW (ref 39.0–52.0)
Hemoglobin: 13.5 g/dL (ref 13.0–17.0)
Immature Granulocytes: 1 %
Lymphocytes Relative: 9 %
Lymphs Abs: 0.6 10*3/uL — ABNORMAL LOW (ref 0.7–4.0)
MCH: 31.6 pg (ref 26.0–34.0)
MCHC: 35.4 g/dL (ref 30.0–36.0)
MCV: 89.2 fL (ref 80.0–100.0)
Monocytes Absolute: 0.3 10*3/uL (ref 0.1–1.0)
Monocytes Relative: 5 %
Neutro Abs: 5.3 10*3/uL (ref 1.7–7.7)
Neutrophils Relative %: 82 %
Platelets: 212 10*3/uL (ref 150–400)
RBC: 4.27 MIL/uL (ref 4.22–5.81)
RDW: 14.6 % (ref 11.5–15.5)
WBC: 6.4 10*3/uL (ref 4.0–10.5)
nRBC: 0 % (ref 0.0–0.2)

## 2021-09-05 LAB — COMPREHENSIVE METABOLIC PANEL
ALT: 15 U/L (ref 0–44)
AST: 21 U/L (ref 15–41)
Albumin: 4.2 g/dL (ref 3.5–5.0)
Alkaline Phosphatase: 90 U/L (ref 38–126)
Anion gap: 8 (ref 5–15)
BUN: 17 mg/dL (ref 8–23)
CO2: 24 mmol/L (ref 22–32)
Calcium: 9 mg/dL (ref 8.9–10.3)
Chloride: 102 mmol/L (ref 98–111)
Creatinine, Ser: 1.05 mg/dL (ref 0.61–1.24)
GFR, Estimated: >60 mL/min (ref >60)
Glucose, Bld: 110 mg/dL — ABNORMAL HIGH (ref 70–99)
Potassium: 4.3 mmol/L (ref 3.5–5.1)
Sodium: 134 mmol/L — ABNORMAL LOW (ref 135–145)
Total Bilirubin: 1 mg/dL (ref 0.3–1.2)
Total Protein: 6.3 g/dL — ABNORMAL LOW (ref 6.5–8.1)

## 2021-09-05 LAB — PSA: Prostatic Specific Antigen: 19.25 ng/mL — ABNORMAL HIGH (ref 0.00–4.00)

## 2021-09-05 MED ORDER — LEUPROLIDE ACETATE (6 MONTH) 45 MG ~~LOC~~ KIT
45.0000 mg | PACK | Freq: Once | SUBCUTANEOUS | Status: AC
  Administered 2021-09-05: 45 mg via SUBCUTANEOUS
  Filled 2021-09-05: qty 45

## 2021-09-05 NOTE — Progress Notes (Signed)
Patient has been assessed, vital signs and labs have been reviewed by Dr. Katragadda. ANC, Creatinine, LFTs, and Platelets are within treatment parameters per Dr. Katragadda. The patient is good to proceed with treatment at this time. Primary RN and pharmacy aware.  

## 2021-09-05 NOTE — Progress Notes (Addendum)
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  INTERVAL HISTORY:  Ryan Walls, a 88 y.o. 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 Ryan Walls was last seen on 07/03/2021.   Today Ryan Walls reports feeling good. Ryan Walls denies groin pain. Ryan Walls denies difficulty urinating and SOB.   REVIEW OF SYSTEMS:  Review of Systems  Constitutional:  Positive for fatigue. Negative for appetite change.  Respiratory:  Negative for shortness of breath.   Genitourinary:  Positive for frequency. Negative for difficulty urinating and pelvic pain.   All other systems reviewed and are negative.  PAST MEDICAL/SURGICAL HISTORY:  Past Medical History:  Diagnosis Date   Prostate cancer ([place])    No past surgical history on file.  SOCIAL HISTORY:  Social History   Socioeconomic History   Marital status: Married    Spouse name: Not on file   Number of children: Not on file   Years of education: Not on file   Highest education level: Not on file  Occupational History   Not on file  Tobacco Use   Smoking status: Former    Types: Cigarettes    Quit date: 11/10/1977    Years since quitting: 43.8   Smokeless tobacco: Never   Vaping Use   Vaping Use: Never used  Substance and Sexual Activity   Alcohol use: Not Currently   Drug use: Never   Sexual activity: Not Currently  Other Topics Concern   Not on file  Social History Narrative   Not on file   Social Determinants of Health   Financial Resource Strain: Not on file  Food Insecurity: Not on file  Transportation Needs: Not on file  Physical Activity: Not on file  Stress: Not on file  Social Connections: Not on file  Intimate Partner Violence: Not on file    FAMILY HISTORY:  No family history on file.  CURRENT MEDICATIONS:  Current Outpatient Medications  Medication Sig Dispense Refill   amiodarone (PACERONE) 100 MG tablet Take 100 mg by mouth daily.     bimatoprost (LUMIGAN) 0.01 % SOLN Apply to eye.     Bromfenac Sodium (PROLENSA) 0.07 % SOLN Prolensa 0.07 % eye drops     Cholecalciferol (VITAMIN D-3) 25 MCG (1000 UT) CAPS 1 capsule     Cholecalciferol 25 MCG (1000 UT) tablet Take 1 tablet by mouth daily.     ciprofloxacin (CILOXAN) 0.3 % ophthalmic solution      ELIQUIS 2.5 MG TABS tablet Take 2.5 mg by mouth 2 (two) times daily.     erythromycin ophthalmic ointment erythromycin 5 mg/gram (0.5 %) eye ointment  PLEASE SEE ATTACHED FOR DETAILED DIRECTIONS     erythromycin ophthalmic ointment PLEASE SEE ATTACHED FOR DETAILED DIRECTIONS  furosemide (LASIX) 20 MG tablet Take 20 mg by mouth daily.     HYDROcodone-acetaminophen (NORCO/VICODIN) 5-325 MG tablet hydrocodone 5 mg-acetaminophen 325 mg tablet  TAKE 1 TABLET BY MOUTH EVERY 4 HOURS AS NEEDED FOR ABDOMINAL CRAMPS OR ABDOMINAL PAIN     metoprolol succinate (TOPROL-XL) 25 MG 24 hr tablet Take 25 mg by mouth every morning.  6   metoprolol tartrate (LOPRESSOR) 25 MG tablet 1 tablet     Multiple Vitamin (MULTI VITAMIN) TABS 1 tablet     oxyCODONE-acetaminophen (PERCOCET/ROXICET) 5-325 MG tablet Take 1 tablet by mouth every 8 (eight) hours as needed for severe pain. 60 tablet 0   prednisoLONE  acetate (PRED FORTE) 1 % ophthalmic suspension instill 1 drop by ophthalmic route 4 times every day into OS, BEGIN GTTS POST OP     predniSONE (DELTASONE) 5 MG tablet TAKE 2 TABLETS BY MOUTH EVERY DAY 60 tablet 12   Rivaroxaban (XARELTO) 15 MG TABS tablet Xarelto 15 mg tablet  TAKE 1 TABLET BY MOUTH EVERY DAY     Vitamin D, Ergocalciferol, (DRISDOL) 50000 units CAPS capsule Take 50,000 Units by mouth daily.      XTANDI 80 MG tablet Take 2 tablets (160 mg) by mouth once daily as directed by physician. 60 tablet 4   No current facility-administered medications for this visit.   Facility-Administered Medications Ordered in Other Visits  Medication Dose Route Frequency Provider Last Rate Last Admin   Leuprolide Acetate (6 Month) (LUPRON) injection 45 mg  45 mg Intramuscular Once Derek Jack, MD        ALLERGIES:  Allergies  Allergen Reactions   No Known Allergies     PHYSICAL EXAM:  Performance status (ECOG): 1 - Symptomatic but completely ambulatory  Vitals:   09/05/21 1420  BP: (!) 151/84  Pulse: 67  Resp: 19  Temp: 98.2 F (36.8 C)  SpO2: 95%   Wt Readings from Last 3 Encounters:  09/05/21 242 lb 6.4 oz (110 kg)  07/03/21 242 lb (109.8 kg)  03/21/21 237 lb 10.5 oz (107.8 kg)   Physical Exam Vitals reviewed.  Constitutional:      Appearance: Normal appearance.  Cardiovascular:     Rate and Rhythm: Normal rate and regular rhythm.     Pulses: Normal pulses.     Heart sounds: Normal heart sounds.  Pulmonary:     Effort: Pulmonary effort is normal.     Breath sounds: Normal breath sounds.  Musculoskeletal:     Right lower leg: No edema.     Left lower leg: No edema.  Neurological:     General: No focal deficit present.     Mental Status: Ryan Walls is alert and oriented to person, place, and time.  Psychiatric:        Mood and Affect: Mood normal.        Behavior: Behavior normal.     LABORATORY DATA:  I have reviewed the labs as listed.  CBC Latest Ref Rng &  Units 09/05/2021 07/03/2021 03/21/2021  WBC 4.0 - 10.5 K/uL 6.4 6.6 5.4  Hemoglobin 13.0 - 17.0 g/dL 13.5 13.6 13.4  Hematocrit 39.0 - 52.0 % 38.1(L) 39.6 38.8(L)  Platelets 150 - 400 K/uL 212 189 180   CMP Latest Ref Rng & Units 09/05/2021 07/03/2021 03/21/2021  Glucose 70 - 99 mg/dL 110(H) 116(H) 101(H)  BUN 8 - 23 mg/dL '17 19 18  ' Creatinine 0.61 - 1.24 mg/dL 1.05 1.24 1.05  Sodium 135 - 145 mmol/L 134(L) 136 134(L)  Potassium 3.5 - 5.1 mmol/L 4.3 4.8 4.5  Chloride 98 - 111 mmol/L 102 104 103  CO2 22 - 32 mmol/L 24 25 21(L)  Calcium 8.9 - 10.3 mg/dL 9.0 9.2 8.6(L)  Total Protein 6.5 - 8.1 g/dL 6.3(L) 6.5 6.4(L)  Total Bilirubin 0.3 - 1.2 mg/dL 1.0 1.0 0.9  Alkaline Phos 38 - 126 U/L 90 97 85  AST 15 - 41 U/L '21 23 22  ' ALT 0 - 44 U/L '15 15 14    ' DIAGNOSTIC IMAGING:  I have independently reviewed the scans and discussed with the patient. NM PET (PSMA) SKULL TO MID THIGH  Result Date: 08/31/2021 CLINICAL DATA:  Prostate cancer. Known bone metastasis. Evaluate treatment response. PSA 11.0. EXAM: NUCLEAR MEDICINE PET SKULL BASE TO THIGH TECHNIQUE: 11.0 mCi F18 Piflufolastat (Pylarify) was injected intravenously. Full-ring PET imaging was performed from the skull base to thigh after the radiotracer. CT data was obtained and used for attenuation correction and anatomic localization. COMPARISON:  10/04/2019 bone scan FINDINGS: NECK No radiotracer activity in neck lymph nodes. Incidental CT finding: Bilateral carotid atherosclerosis. No cervical adenopathy. Maxillary sinus wall thickening, likely related to remote prior infection. CHEST No tracer avid thoracic nodes. Spiculated right apical lung nodule measures 2.9 x 2.3 cm and 3.4 on 11/08. Incidental CT finding: Mild centrilobular emphysema. Left lower lobe calcified granuloma. Aortic and coronary artery calcification. Mild cardiomegaly. ABDOMEN/PELVIS Prostate: Atrophic prostate versus less likely prior prostatectomy. Prostatic uptake is greater  right than left and contiguous with extraprostatic gross locally advanced disease. Example soft tissue density mass which involves the right seminal vesicle, measuring on the order of 2.5 x 2.7 cm and a S.U.V. max of 28.2. Contiguous prostatic tracer affinity measures a S.U.V. max of 37.1. This extraprostatic gross disease contacts the right obturator internus muscle on 217/4. Cannot exclude bladder involvement on 216/4. Lymph nodes: No abnormal radiotracer accumulation within pelvic or abdominal nodes. Liver: No evidence of liver metastasis Incidental CT finding: Hepatic cysts. Stone filled gallbladder. Normal adrenal glands. Abdominal aortic atherosclerosis. Extensive colonic diverticulosis. Small fat containing right inguinal hernia. SKELETON No focal  activity to suggest skeletal metastasis. IMPRESSION: 1. Residual/recurrent disease, presumably within an atrophic prostate. Locally advanced right-sided disease involving the seminal vesicle, possibly urinary bladder, and contact with the right pelvic sidewall. 2. Tracer avid right upper lobe/apical lung nodule, favoring synchronous primary bronchogenic carcinoma. 3. Incidental findings, including: Coronary artery atherosclerosis. Aortic Atherosclerosis (ICD10-I70.0). Cholelithiasis. Electronically Signed   By: Abigail Miyamoto M.D.   On: 08/31/2021 17:19     ASSESSMENT:  1.  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 -Prostate biopsy on 02/06/2015 consistent with adenocarcinoma, Gleason 4+5 =9. -Zytiga and prednisone from 05/05/2016 through 10/11/2019 with progression. -Current 360 showed some obscure mutation with a clinical trial in [place]. -Last Lupron 45 mg on 11/16/2019. -Enzalutamide started on 11/03/2018.   PLAN:  1.  Metastatic CRPC to the bones: - Ryan Walls is taking enzalutamide 160 mg daily. - PET scan was delayed due to hip pain which has resolved at this time. - We have reviewed images  of the PSMA PET scan from 08/31/2021 which showed locally advanced right-sided disease involving seminal vesicle measuring 2.5 x 2.7 cm with SUV 28, possibly urinary bladder in contact with the right pelvic sidewall. - Options include [id number]. - I will reach out to radiation oncology to see if the pelvic lesions can be radiated. - Labs today shows normal CBC and CMP.  We will follow-up on PSA tomorrow. - Ryan Walls will receive Lupron 45 mg today.   2.  Bone metastasis: - Continue calcium and vitamin D supplements.   3.  Atrial fibrillation: - Continue Xarelto, amiodarone and Toprol-XL.  4.  Right upper lobe lung nodule: - Ryan Walls has history of smoking and quit 20 years ago. - Right lung nodule measuring 2.9 x 2.3 cm which is mildly avid. - Ryan Walls reports that Ryan Walls had right lung nodule for many years.  I will obtain scans/x-rays done previously at [facility] and review them. - If there is any growth from previously, will consider bronchoscopy and biopsy.  Alternatively Ryan Walls could undergo SBRT with or without biopsy.   Orders placed this encounter:  No orders of the defined types were placed in this encounter.    Derek Jack, MD [place] [phone]   I, Thana Ates, am acting as a scribe for Dr. Derek Jack.  I, Derek Jack MD, have reviewed the above documentation for accuracy and completeness, and I agree with the above.

## 2021-09-05 NOTE — Patient Instructions (Signed)
[place] at [facility] Discharge Instructions  You were seen and examined today by Dr. Delton Coombes. He reviewed your most recent labs and everything looks good. Still waiting on the PSA results. Referral sent to Dr. Theadore Nan Dr. Myrle Sheng at [facility] in [place]. We will obtain records from [place]. Please keep follow up appointment as scheduled in 2 months.   Thank you for choosing [place] at [facility] to provide your oncology and hematology care.  To afford each patient quality time with our provider, please arrive at least 15 minutes before your scheduled appointment time.   If you have a lab appointment with the [facility] please come in thru the Main Entrance and check in at the main information desk.  You need to re-schedule your appointment should you arrive 10 or more minutes late.  We strive to give you quality time with our providers, and arriving late affects you and other patients whose appointments are after yours.  Also, if you no show three or more times for appointments you may be dismissed from the clinic at the providers discretion.     Again, thank you for choosing [facility].  Our hope is that these requests will decrease the amount of time that you wait before being seen by our physicians.       _____________________________________________________________  Should you have questions after your visit to [facility], please contact our office at [phone] and follow the prompts.  Our office hours are 8:00 a.m. and 4:30 p.m. Monday - Friday.  Please note that voicemails left after 4:00 p.m. may not be returned until the following business day.  We are closed weekends and major holidays.  You do have access to a nurse 24-7, just call the main number to the clinic [phone] and do not press any options, hold on the line and a nurse will answer the phone.    For prescription refill  requests, have your pharmacy contact our office and allow 72 hours.    Due to Covid, you will need to wear a mask upon entering the hospital. If you do not have a mask, a mask will be given to you at the Main Entrance upon arrival. For doctor visits, patients may have 1 support person age 13 or older with them. For treatment visits, patients can not have anyone with them due to social distancing guidelines and our immunocompromised population.

## 2021-09-05 NOTE — Patient Instructions (Signed)
[facility]  Discharge Instructions: Thank you for choosing [facility] to provide your oncology and hematology care.  If you have a lab appointment with the Cancer Center, please come in thru the Main Entrance and check in at the main information desk.  Wear comfortable clothing and clothing appropriate for easy access to any Portacath or PICC line.   We strive to give you quality time with your provider. You may need to reschedule your appointment if you arrive late (15 or more minutes).  Arriving late affects you and other patients whose appointments are after yours.  Also, if you miss three or more appointments without notifying the office, you may be dismissed from the clinic at the provider's discretion.      For prescription refill requests, have your pharmacy contact our office and allow 72 hours for refills to be completed.        To help prevent nausea and vomiting after your treatment, we encourage you to take your nausea medication as directed.  BELOW ARE SYMPTOMS THAT SHOULD BE REPORTED IMMEDIATELY: *FEVER GREATER THAN 100.4 F (38 C) OR HIGHER *CHILLS OR SWEATING *NAUSEA AND VOMITING THAT IS NOT CONTROLLED WITH YOUR NAUSEA MEDICATION *UNUSUAL SHORTNESS OF BREATH *UNUSUAL BRUISING OR BLEEDING *URINARY PROBLEMS (pain or burning when urinating, or frequent urination) *BOWEL PROBLEMS (unusual diarrhea, constipation, pain near the anus) TENDERNESS IN MOUTH AND THROAT WITH OR WITHOUT PRESENCE OF ULCERS (sore throat, sores in mouth, or a toothache) UNUSUAL RASH, SWELLING OR PAIN  UNUSUAL VAGINAL DISCHARGE OR ITCHING   Items with * indicate a potential emergency and should be followed up as soon as possible or go to the Emergency Department if any problems should occur.  Please show the CHEMOTHERAPY ALERT CARD or IMMUNOTHERAPY ALERT CARD at check-in to the Emergency Department and triage nurse.  Should you have questions after your visit or need to cancel  or reschedule your appointment, please contact  CANCER CENTER [phone]  and follow the prompts.  Office hours are 8:00 a.m. to 4:30 p.m. Monday - Friday. Please note that voicemails left after 4:00 p.m. may not be returned until the following business day.  We are closed weekends and major holidays. You have access to a nurse at all times for urgent questions. Please call the main number to the clinic [phone] and follow the prompts.  For any non-urgent questions, you may also contact your provider using MyChart. We now offer e-Visits for anyone 18 and older to request care online for non-urgent symptoms. For details visit [email or web address].   Also download the MyChart app! Go to the app store, search "MyChart", open the app, select [place], and log in with your MyChart username and password.  Due to Covid, a mask is required upon entering the hospital/clinic. If you do not have a mask, one will be given to you upon arrival. For doctor visits, patients may have 1 support person aged 18 or older with them. For treatment visits, patients cannot have anyone with them due to current Covid guidelines and our immunocompromised population.  

## 2021-09-05 NOTE — Progress Notes (Signed)
Patient presents today for Lupron injection.  Patient is in satisfactory condition with no complaints voiced.  Vital signs are stable.  Labs reviewed by Dr. Delton Coombes during his office visit.  All labs are within treatment parameters.  We will proceed with treatment per MD orders.   Patient tolerated injection with no complaints voiced.  Site clean and dry with no bruising or swelling noted.  No complaints of pain.  Discharged with vital signs stable and no signs or symptoms of distress noted.

## 2021-10-10 ENCOUNTER — Telehealth (HOSPITAL_COMMUNITY): Payer: Medicare Other | Admitting: Licensed Clinical Social Worker

## 2021-11-04 ENCOUNTER — Other Ambulatory Visit (HOSPITAL_COMMUNITY): Payer: Medicare Other

## 2021-11-04 ENCOUNTER — Ambulatory Visit (HOSPITAL_COMMUNITY): Payer: Medicare Other | Admitting: Hematology

## 2022-01-02 DEATH — deceased

## 2022-03-05 ENCOUNTER — Ambulatory Visit (HOSPITAL_COMMUNITY): Payer: Medicare Other

## 2022-04-03 ENCOUNTER — Other Ambulatory Visit (HOSPITAL_COMMUNITY): Payer: Self-pay
